# Patient Record
Sex: Male | Born: 1995 | Race: Black or African American | Hispanic: No | Marital: Single | Smoking: Current every day smoker
Health system: Southern US, Community
[De-identification: ages and names within clinical notes are randomized; demographics above are authoritative.]

## PROBLEM LIST (undated history)

## (undated) DIAGNOSIS — Z789 Other specified health status: Secondary | ICD-10-CM

## (undated) HISTORY — PX: NO PAST SURGERIES: SHX2092

---
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
## 2013-09-14 ENCOUNTER — Emergency Department (HOSPITAL_COMMUNITY): Payer: No Typology Code available for payment source

## 2013-09-14 ENCOUNTER — Emergency Department (HOSPITAL_COMMUNITY)
Admission: EM | Admit: 2013-09-14 | Discharge: 2013-09-14 | Disposition: A | Discharge status: Home / Self Care | Payer: No Typology Code available for payment source | Attending: Emergency Medicine | Admitting: Emergency Medicine

## 2013-09-14 ENCOUNTER — Encounter (HOSPITAL_COMMUNITY): Payer: Self-pay | Admitting: Emergency Medicine

## 2013-09-14 DIAGNOSIS — R519 Headache, unspecified: Secondary | ICD-10-CM

## 2013-09-14 DIAGNOSIS — S0990XA Unspecified injury of head, initial encounter: Secondary | ICD-10-CM | POA: Insufficient documentation

## 2013-09-14 DIAGNOSIS — S86912A Strain of unspecified muscle(s) and tendon(s) at lower leg level, left leg, initial encounter: Secondary | ICD-10-CM

## 2013-09-14 DIAGNOSIS — IMO0002 Reserved for concepts with insufficient information to code with codable children: Secondary | ICD-10-CM | POA: Insufficient documentation

## 2013-09-14 DIAGNOSIS — F17200 Nicotine dependence, unspecified, uncomplicated: Secondary | ICD-10-CM | POA: Insufficient documentation

## 2013-09-14 DIAGNOSIS — Y9389 Activity, other specified: Secondary | ICD-10-CM | POA: Insufficient documentation

## 2013-09-14 DIAGNOSIS — Y92410 Unspecified street and highway as the place of occurrence of the external cause: Secondary | ICD-10-CM | POA: Insufficient documentation

## 2013-09-14 MED ORDER — CYCLOBENZAPRINE HCL 10 MG PO TABS: 10.0000 mg | ORAL_TABLET | Freq: Two times a day (BID) | ORAL | Status: DC | PRN

## 2013-09-14 MED ORDER — TRAMADOL HCL 50 MG PO TABS: 50.0000 mg | ORAL_TABLET | Freq: Four times a day (QID) | ORAL | Status: DC | PRN

## 2013-09-14 MED ORDER — TRAMADOL HCL 50 MG PO TABS
50.0000 mg | ORAL_TABLET | Freq: Once | ORAL | Status: AC
  Administered 2013-09-14: 50 mg via ORAL
  Filled 2013-09-14: qty 1
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
## 2013-09-14 NOTE — ED Provider Notes (Signed)
Medical screening examination/treatment/procedure(s) were performed by non-physician practitioner and as supervising physician I was immediately available for consultation/collaboration.  EKG Interpretation   None        Rye Dorado, MD 09/14/13 2336 
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
## 2014-08-09 ENCOUNTER — Encounter (HOSPITAL_COMMUNITY): Payer: Self-pay

## 2014-08-09 ENCOUNTER — Emergency Department (HOSPITAL_COMMUNITY)
Admission: EM | Admit: 2014-08-09 | Discharge: 2014-08-09 | Disposition: A | Discharge status: Home / Self Care | Payer: No Typology Code available for payment source | Attending: Emergency Medicine | Admitting: Emergency Medicine

## 2014-08-09 DIAGNOSIS — Z202 Contact with and (suspected) exposure to infections with a predominantly sexual mode of transmission: Secondary | ICD-10-CM | POA: Insufficient documentation

## 2014-08-09 DIAGNOSIS — Z720 Tobacco use: Secondary | ICD-10-CM | POA: Insufficient documentation

## 2014-08-09 DIAGNOSIS — Z79899 Other long term (current) drug therapy: Secondary | ICD-10-CM | POA: Insufficient documentation

## 2014-08-09 MED ORDER — AZITHROMYCIN 250 MG PO TABS
1000.0000 mg | ORAL_TABLET | Freq: Once | ORAL | Status: AC
  Administered 2014-08-09: 1000 mg via ORAL
  Filled 2014-08-09: qty 4

## 2014-08-09 NOTE — ED Notes (Signed)
Pt states that his girlfriend was dx with chlamydia and he wants to be checked
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
## 2014-08-10 LAB — GC/CHLAMYDIA PROBE AMP
CT PROBE, AMP APTIMA: POSITIVE — AB
GC PROBE AMP APTIMA: NEGATIVE

## 2014-08-11 ENCOUNTER — Telehealth: Payer: Self-pay | Admitting: Emergency Medicine

## 2014-08-11 NOTE — Telephone Encounter (Signed)
Positive Chlamydia culture Treated with Zithromax per protocol MD [facility] faxed.  08/11/14 @ 1652 left voicemail for patient to call office #

## 2014-08-12 ENCOUNTER — Telehealth (HOSPITAL_COMMUNITY): Payer: Self-pay

## 2014-08-12 NOTE — ED Notes (Signed)
spoke with pt. verified ID. informed of lab results. treated per protocol. advised to notify partner(s)

## 2014-12-04 ENCOUNTER — Emergency Department (HOSPITAL_COMMUNITY)
Admission: EM | Admit: 2014-12-04 | Discharge: 2014-12-04 | Disposition: A | Discharge status: Home / Self Care | Payer: Self-pay

## 2014-12-04 ENCOUNTER — Encounter (HOSPITAL_COMMUNITY): Payer: Self-pay | Admitting: Emergency Medicine

## 2014-12-04 DIAGNOSIS — Y9289 Other specified places as the place of occurrence of the external cause: Secondary | ICD-10-CM | POA: Insufficient documentation

## 2014-12-04 DIAGNOSIS — Y998 Other external cause status: Secondary | ICD-10-CM | POA: Insufficient documentation

## 2014-12-04 DIAGNOSIS — T50901A Poisoning by unspecified drugs, medicaments and biological substances, accidental (unintentional), initial encounter: Secondary | ICD-10-CM | POA: Diagnosis present

## 2014-12-04 DIAGNOSIS — Y9389 Activity, other specified: Secondary | ICD-10-CM | POA: Insufficient documentation

## 2014-12-04 DIAGNOSIS — T1491XA Suicide attempt, initial encounter: Secondary | ICD-10-CM | POA: Diagnosis present

## 2014-12-04 DIAGNOSIS — F4325 Adjustment disorder with mixed disturbance of emotions and conduct: Secondary | ICD-10-CM | POA: Diagnosis present

## 2014-12-04 DIAGNOSIS — Z720 Tobacco use: Secondary | ICD-10-CM | POA: Insufficient documentation

## 2014-12-04 DIAGNOSIS — R000 Tachycardia, unspecified: Secondary | ICD-10-CM | POA: Insufficient documentation

## 2014-12-04 DIAGNOSIS — X58XXXA Exposure to other specified factors, initial encounter: Secondary | ICD-10-CM | POA: Insufficient documentation

## 2014-12-04 DIAGNOSIS — T1491 Suicide attempt: Secondary | ICD-10-CM

## 2014-12-04 DIAGNOSIS — F322 Major depressive disorder, single episode, severe without psychotic features: Secondary | ICD-10-CM | POA: Diagnosis present

## 2014-12-04 DIAGNOSIS — T378X1A Poisoning by other specified systemic anti-infectives and antiparasitics, accidental (unintentional), initial encounter: Secondary | ICD-10-CM | POA: Insufficient documentation

## 2014-12-04 DIAGNOSIS — Z79899 Other long term (current) drug therapy: Secondary | ICD-10-CM | POA: Insufficient documentation

## 2014-12-04 LAB — ACETAMINOPHEN LEVEL: Acetaminophen (Tylenol), Serum: <10 ug/mL — ABNORMAL LOW (ref 10–30)

## 2014-12-04 LAB — CBC
HEMATOCRIT: 44.5 % (ref 39.0–52.0)
Hemoglobin: 14.9 g/dL (ref 13.0–17.0)
MCH: 32.3 pg (ref 26.0–34.0)
MCHC: 33.5 g/dL (ref 30.0–36.0)
MCV: 96.3 fL (ref 78.0–100.0)
Platelets: 208 10*3/uL (ref 150–400)
RBC: 4.62 MIL/uL (ref 4.22–5.81)
RDW: 13.1 % (ref 11.5–15.5)
WBC: 5.2 10*3/uL (ref 4.0–10.5)

## 2014-12-04 LAB — COMPREHENSIVE METABOLIC PANEL
ALBUMIN: 4.3 g/dL (ref 3.5–5.2)
ALT: 13 U/L (ref 0–53)
ANION GAP: 10 (ref 5–15)
AST: 23 U/L (ref 0–37)
Alkaline Phosphatase: 67 U/L (ref 39–117)
BILIRUBIN TOTAL: 0.5 mg/dL (ref 0.3–1.2)
BUN: 13 mg/dL (ref 6–23)
CALCIUM: 8.9 mg/dL (ref 8.4–10.5)
CHLORIDE: 110 mmol/L (ref 96–112)
CO2: 21 mmol/L (ref 19–32)
CREATININE: 0.94 mg/dL (ref 0.50–1.35)
GFR calc non Af Amer: >90 mL/min (ref >90)
GLUCOSE: 88 mg/dL (ref 70–99)
POTASSIUM: 3.5 mmol/L (ref 3.5–5.1)
Sodium: 141 mmol/L (ref 135–145)
TOTAL PROTEIN: 7.8 g/dL (ref 6.0–8.3)

## 2014-12-04 LAB — ETHANOL: Alcohol, Ethyl (B): 104 mg/dL — ABNORMAL HIGH (ref 0–9)

## 2014-12-04 LAB — SALICYLATE LEVEL

## 2014-12-04 MED ORDER — MIDAZOLAM HCL 2 MG/2ML IJ SOLN
INTRAMUSCULAR | Status: AC
  Filled 2014-12-04: qty 4

## 2014-12-04 MED ORDER — ZIPRASIDONE MESYLATE 20 MG IM SOLR: 20.0000 mg | Freq: Once | INTRAMUSCULAR | Status: DC

## 2014-12-04 MED ORDER — LORAZEPAM 2 MG/ML IJ SOLN: 2.0000 mg | Freq: Once | INTRAMUSCULAR | Status: DC

## 2014-12-04 MED ORDER — STERILE WATER FOR INJECTION IJ SOLN
INTRAMUSCULAR | Status: AC
  Administered 2014-12-04: 2 mL
  Filled 2014-12-04: qty 10

## 2014-12-04 MED ORDER — NICOTINE 21 MG/24HR TD PT24: 21.0000 mg | MEDICATED_PATCH | Freq: Every day | TRANSDERMAL | Status: DC

## 2014-12-04 MED ORDER — DIPHENHYDRAMINE HCL 50 MG/ML IJ SOLN: 50.0000 mg | Freq: Once | INTRAMUSCULAR | Status: DC

## 2014-12-04 MED ORDER — ZIPRASIDONE MESYLATE 20 MG IM SOLR: 20.0000 mg | Freq: Once | INTRAMUSCULAR | Status: DC | PRN

## 2014-12-04 MED ORDER — ZIPRASIDONE MESYLATE 20 MG IM SOLR
10.0000 mg | Freq: Once | INTRAMUSCULAR | Status: AC
  Administered 2014-12-04: 10 mg via INTRAMUSCULAR

## 2014-12-04 MED ORDER — ZIPRASIDONE MESYLATE 20 MG IM SOLR
INTRAMUSCULAR | Status: AC
  Filled 2014-12-04: qty 20

## 2014-12-04 MED ORDER — DIPHENHYDRAMINE HCL 50 MG/ML IJ SOLN: 50.0000 mg | Freq: Once | INTRAMUSCULAR | Status: DC | PRN

## 2014-12-04 MED ORDER — MIDAZOLAM HCL 2 MG/2ML IJ SOLN
4.0000 mg | Freq: Once | INTRAMUSCULAR | Status: AC
  Administered 2014-12-04: 4 mg via INTRAMUSCULAR

## 2014-12-04 MED ORDER — LORAZEPAM 2 MG/ML IJ SOLN: 2.0000 mg | Freq: Once | INTRAMUSCULAR | Status: DC | PRN

## 2014-12-04 MED ORDER — LORAZEPAM 1 MG PO TABS: 1.0000 mg | ORAL_TABLET | Freq: Three times a day (TID) | ORAL | Status: DC | PRN
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
## 2014-12-04 NOTE — ED Notes (Signed)
Per EMS: Pt from home.  States that his girlfriend broke up with him so he took 3 (100 mg) tabs of macrobid (still 2 pills left in bottle).  Took them appx 15 mins prior to EMS arrival.  A&O x 4.  States that he wants to die.

## 2014-12-04 NOTE — BH Assessment (Addendum)
Assessment Note   Travis Mcguire is an 19 y.o. male who came in after taking three of his girlfriends antibiotics. Earlier in the ED he was combative with staff, threatening, yelling and had to be restrained. He states that he was "just having a bad day" and got angry. He states that he currently lives with his girlfriend and they got into an argument earlier today about him staying out all night. She stated that she was going to leave him and he took the three pills impulsively. His alcohol level was 104 on admission. He states that he has been stressed lately and admits he has been depressed. He says that he has increased stress due to living on his own and paying bills. He currently denies SI/HI and A/V hallucinations. Despite earlier behavior pt was very calm and cooperative during assessment. His mom was in the room and she agreed to let him stay with her tonight. He signed a no harm contract and stated that he does not feel suicidal at this time. Mom signed the contract as a witness as well as Clinical research associatewriter. Pt to be discharged home to the care of his mom.   Axis I: 296.23 Major Depressive Disorder Single Episode Severe Axis II: Deferred Axis III: History reviewed. No pertinent past medical history. Axis IV: other psychosocial or environmental problems and problems with primary support group Axis V: 41-50 serious symptoms  Past Medical History: History reviewed. No pertinent past medical history.  History reviewed. No pertinent past surgical history.  Family History: History reviewed. No pertinent family history.  Social History:  reports that he has been smoking Cigarettes.  He has been smoking about 0.25 packs per day. He has never used smokeless tobacco. He reports that he uses illicit drugs (Marijuana). He reports that he does not drink alcohol.  Additional Social History:  Alcohol / Drug Use History of alcohol / drug use?: Yes Longest period of sobriety (when/how long): unknown Negative  Consequences of Use: Personal relationships Substance #1 Name of Substance 1: Alcohol  1 - Age of First Use: unknown 1 - Amount (size/oz): small bottle of liquor 1 - Frequency: "once in a while" 1 - Duration: all night  1 - Last Use / Amount: last night   CIWA: CIWA-Ar BP: 110/75 mmHg Pulse Rate: 88 COWS:    PATIENT STRENGTHS: (choose at least two) Average or above average intelligence Physical Health  Allergies: No Known Allergies  Home Medications:  (Not in a hospital admission)  OB/GYN Status:  No LMP for male patient.  General Assessment Data Location of Assessment: [facility] ED Is this a Tele or Face-to-Face Assessment?: Face-to-Face Is this an Initial Assessment or a Re-assessment for this encounter?: Initial Assessment Living Arrangements: Spouse/significant other Can pt return to current living arrangement?: Yes Admission Status: Involuntary Is patient capable of signing voluntary admission?: No Transfer from: Home Referral Source: Self/Family/Friend     [facility] Living Arrangements: Spouse/significant other Name of Psychiatrist:  (None) Name of Therapist: None  Education Status Is patient currently in school?: No Highest grade of school patient has completed: 12th  Risk to self with the past 6 months Suicidal Ideation:  (Denies) Suicidal Intent: No Is patient at risk for suicide?: No Suicidal Plan?: No Access to Means: No What has been your use of drugs/alcohol within the last 12 months?: alcohol last night Previous Attempts/Gestures: No How many times?: 0 Other Self Harm Risks: none Triggers for Past Attempts: None known Intentional Self Injurious Behavior:  None Family Suicide History: No Recent stressful life event(s): Conflict (Comment) (conflict with girlfriend) Persecutory voices/beliefs?: No Depression: Yes Depression Symptoms: Feeling worthless/self pity Substance abuse history and/or treatment for substance abuse?: No Suicide  prevention information given to non-admitted patients: Not applicable  Risk to Others within the past 6 months Homicidal Ideation: No Thoughts of Harm to Others: No Current Homicidal Intent: No Current Homicidal Plan: No Access to Homicidal Means: No Identified Victim: none History of harm to others?: No Assessment of Violence: On admission Violent Behavior Description:  (yelling, threatening needed to be restrained in ED) Does patient have access to weapons?: No Criminal Charges Pending?: No Does patient have a court date: No  Psychosis Hallucinations: None noted Delusions: None noted  Mental Status Report Appear/Hygiene: In scrubs Eye Contact: Good Motor Activity: Freedom of movement Speech: Logical/coherent Level of Consciousness: Alert Mood: Depressed Affect: Depressed Anxiety Level: None Thought Processes: Coherent Judgement: Impaired Orientation: Person, Place, Time, Situation Obsessive Compulsive Thoughts/Behaviors: None  Cognitive Functioning Concentration: Normal Memory: Recent Intact, Remote Intact IQ: Average Insight: Poor Impulse Control: Poor Appetite: Good Weight Loss: 0 Weight Gain: 0 Sleep: Decreased Total Hours of Sleep: 2 Vegetative Symptoms: Unable to Assess  ADLScreening [facility] Assessment Services) Patient's cognitive ability adequate to safely complete daily activities?: Yes Patient able to express need for assistance with ADLs?: Yes Independently performs ADLs?: Yes (appropriate for developmental age)  Prior Inpatient Therapy Prior Inpatient Therapy: No  Prior Outpatient Therapy Prior Outpatient Therapy: No  ADL Screening (condition at time of admission) Patient's cognitive ability adequate to safely complete daily activities?: Yes Is the patient deaf or have difficulty hearing?: No Does the patient have difficulty seeing, even when wearing glasses/contacts?: No Does the patient have difficulty concentrating, remembering, or making  decisions?: No Patient able to express need for assistance with ADLs?: Yes Does the patient have difficulty dressing or bathing?: No Independently performs ADLs?: Yes (appropriate for developmental age) Does the patient have difficulty walking or climbing stairs?: No Weakness of Legs: None Weakness of Arms/Hands: None  Home Assistive Devices/Equipment Home Assistive Devices/Equipment: None    Abuse/Neglect Assessment (Assessment to be complete while patient is alone) Physical Abuse: Denies Verbal Abuse: Denies Sexual Abuse: Denies Exploitation of patient/patient's resources: Denies Self-Neglect: Denies Values / Beliefs Cultural Requests During Hospitalization: None Spiritual Requests During Hospitalization: None Consults Spiritual Care Consult Needed: No Advance Directives (For Healthcare) Does patient have an advance directive?: No Would patient like information on creating an advanced directive?: No - patient declined information    Additional Information 1:1 In Past 12 Months?: No     Disposition:  Disposition Initial Assessment Completed for this Encounter: Yes Disposition of Patient: Other dispositions Other disposition(s): Referred to outside facility  [facility] 12/04/2014 4:48 PM

## 2014-12-04 NOTE — ED Notes (Signed)
Dr. Shela CommonsJ has just seen him and informed him he may not leave.  Pt. Had loudly proclaimed his intention to leave to Dr. Shela CommonsJ.

## 2014-12-04 NOTE — ED Notes (Signed)
Attempted to talk with pt about what happened today.  States that he does not want to live and that he took pills to kill himself d/t argument with girlfriend.  Med clearance process explained.  Pt states "I ain't changing shit for nobody!".  Pt continued to escalate.  [employer] at bedside.  Continued to escalate.  Threatening staff.  Dr. Ethelda ChickJacubowitz called to bedside.  IVC commitment orders received.  Attempted to calmly have pt cooperate.  This was not happening.  Pt restrained by [employer] cuffs to stretcher chair.  4 mg of versed given in two separate shots into upper leg by this Clinical research associatewriter and Alexia FreestonePatty, Charity fundraiserN.  Pt moved to rm 14.
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
## 2014-12-04 NOTE — ED Notes (Signed)
He is awake and eating lunch.  His mother and our sitter remain with him.  He is in no distress.  Dr. Shela CommonsJ states he will see pt. [facility]

## 2015-08-08 ENCOUNTER — Emergency Department (HOSPITAL_COMMUNITY)
Admission: EM | Admit: 2015-08-08 | Discharge: 2015-08-11 | Disposition: A | Discharge status: Other Acute Inpatient Hospital | Payer: Self-pay | Attending: Emergency Medicine | Admitting: Emergency Medicine

## 2015-08-08 ENCOUNTER — Encounter (HOSPITAL_COMMUNITY): Payer: Self-pay | Admitting: Emergency Medicine

## 2015-08-08 DIAGNOSIS — F329 Major depressive disorder, single episode, unspecified: Secondary | ICD-10-CM | POA: Insufficient documentation

## 2015-08-08 DIAGNOSIS — F1094 Alcohol use, unspecified with alcohol-induced mood disorder: Secondary | ICD-10-CM

## 2015-08-08 DIAGNOSIS — R45851 Suicidal ideations: Secondary | ICD-10-CM | POA: Insufficient documentation

## 2015-08-08 DIAGNOSIS — F32A Depression, unspecified: Secondary | ICD-10-CM

## 2015-08-08 DIAGNOSIS — F4325 Adjustment disorder with mixed disturbance of emotions and conduct: Secondary | ICD-10-CM | POA: Diagnosis present

## 2015-08-08 DIAGNOSIS — Z720 Tobacco use: Secondary | ICD-10-CM | POA: Insufficient documentation

## 2015-08-08 DIAGNOSIS — F1310 Sedative, hypnotic or anxiolytic abuse, uncomplicated: Secondary | ICD-10-CM | POA: Insufficient documentation

## 2015-08-08 DIAGNOSIS — F1210 Cannabis abuse, uncomplicated: Secondary | ICD-10-CM | POA: Insufficient documentation

## 2015-08-08 LAB — COMPREHENSIVE METABOLIC PANEL
ALBUMIN: 4.8 g/dL (ref 3.5–5.0)
ALK PHOS: 60 U/L (ref 38–126)
ALT: 12 U/L — ABNORMAL LOW (ref 17–63)
AST: 22 U/L (ref 15–41)
Anion gap: 11 (ref 5–15)
BILIRUBIN TOTAL: 0.6 mg/dL (ref 0.3–1.2)
BUN: 13 mg/dL (ref 6–20)
CALCIUM: 9.4 mg/dL (ref 8.9–10.3)
CO2: 25 mmol/L (ref 22–32)
Chloride: 106 mmol/L (ref 101–111)
Creatinine, Ser: 1.06 mg/dL (ref 0.61–1.24)
GFR calc Af Amer: >60 mL/min (ref >60)
GLUCOSE: 106 mg/dL — AB (ref 65–99)
POTASSIUM: 3.2 mmol/L — AB (ref 3.5–5.1)
Sodium: 142 mmol/L (ref 135–145)
TOTAL PROTEIN: 8 g/dL (ref 6.5–8.1)

## 2015-08-08 LAB — ACETAMINOPHEN LEVEL

## 2015-08-08 LAB — CBC
HEMATOCRIT: 43.2 % (ref 39.0–52.0)
Hemoglobin: 15.3 g/dL (ref 13.0–17.0)
MCH: 33.3 pg (ref 26.0–34.0)
MCHC: 35.4 g/dL (ref 30.0–36.0)
MCV: 93.9 fL (ref 78.0–100.0)
PLATELETS: 155 10*3/uL (ref 150–400)
RBC: 4.6 MIL/uL (ref 4.22–5.81)
RDW: 12.8 % (ref 11.5–15.5)
WBC: 5.7 10*3/uL (ref 4.0–10.5)

## 2015-08-08 LAB — ETHANOL: ALCOHOL ETHYL (B): 54 mg/dL — AB (ref <5)

## 2015-08-08 LAB — SALICYLATE LEVEL: Salicylate Lvl: <4 mg/dL (ref 2.8–30.0)

## 2015-08-08 MED ORDER — ONDANSETRON HCL 4 MG PO TABS: 4.0000 mg | ORAL_TABLET | Freq: Three times a day (TID) | ORAL | Status: DC | PRN

## 2015-08-08 MED ORDER — ZOLPIDEM TARTRATE 5 MG PO TABS
5.0000 mg | ORAL_TABLET | Freq: Every evening | ORAL | Status: DC | PRN
  Administered 2015-08-09: 5 mg via ORAL
  Filled 2015-08-08: qty 1

## 2015-08-08 MED ORDER — IBUPROFEN 200 MG PO TABS: 600.0000 mg | ORAL_TABLET | Freq: Three times a day (TID) | ORAL | Status: DC | PRN

## 2015-08-08 MED ORDER — ACETAMINOPHEN 325 MG PO TABS: 650.0000 mg | ORAL_TABLET | ORAL | Status: DC | PRN

## 2015-08-08 MED ORDER — LORAZEPAM 1 MG PO TABS
1.0000 mg | ORAL_TABLET | Freq: Three times a day (TID) | ORAL | Status: DC | PRN
  Administered 2015-08-09 (×2): 1 mg via ORAL
  Filled 2015-08-08 (×3): qty 1

## 2015-08-08 NOTE — ED Notes (Signed)
Pt states that he has been stressed out "for a long time" and stated that he did want to hurt himself. Denies plan. Alert and oriented.

## 2015-08-09 DIAGNOSIS — R45851 Suicidal ideations: Secondary | ICD-10-CM | POA: Insufficient documentation

## 2015-08-09 DIAGNOSIS — R45850 Homicidal ideations: Secondary | ICD-10-CM

## 2015-08-09 DIAGNOSIS — F1094 Alcohol use, unspecified with alcohol-induced mood disorder: Secondary | ICD-10-CM

## 2015-08-09 MED ORDER — DIPHENHYDRAMINE HCL 50 MG/ML IJ SOLN: 50.0000 mg | Freq: Once | INTRAMUSCULAR | Status: DC | PRN

## 2015-08-09 MED ORDER — LORAZEPAM 2 MG/ML IJ SOLN: 2.0000 mg | Freq: Once | INTRAMUSCULAR | Status: DC | PRN

## 2015-08-09 MED ORDER — ZIPRASIDONE MESYLATE 20 MG IM SOLR: 20.0000 mg | Freq: Once | INTRAMUSCULAR | Status: DC | PRN

## 2015-08-09 NOTE — ED Notes (Signed)
Pt resting in room, in no acute distress 

## 2015-08-09 NOTE — ED Notes (Signed)
Belongings sent home with friend- [place] [phone]

## 2015-08-09 NOTE — ED Notes (Signed)
Pt resting well, in acute distress

## 2015-08-09 NOTE — ED Notes (Signed)
Pt stated that he doesn't have to use restroom at this time.

## 2015-08-09 NOTE — ED Notes (Signed)
Pt will see psychiatry in the am for disposition

## 2015-08-09 NOTE — BH Assessment (Signed)
[facility] Assessment Progress Note  The following facilities have been contacted to seek placement for this pt, with results as noted:  Beds available, information sent, decision pending:  [facility]   At capacity:  [facility]   Josselin Gaulin, MA Triage Specialist [phone]     
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
## 2015-08-09 NOTE — ED Notes (Signed)
Pt oriented to room and unit.  Patient is very irritable and states he will "F-this place up if yall don't let me go"  He was explained the procedure and about his IVC.  He remains extremely angry.  15 minute checks and video monitoring continue.  When his lunch came he became verbally abusive and said "I can't eat this shit":

## 2015-08-09 NOTE — ED Notes (Signed)
Pt asleep at time of vitals. RN notified and RN directed this Clinical research associatewriter not to wake pt up as he was agitated before falling asleep.
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
## 2015-08-09 NOTE — Progress Notes (Signed)
CM spoke with pt who [place].  CM discussed and provided written information for uninsured accepting pcps, discussed the importance of pcp vs EDP services for f/u care, www.needymeds.org, www.goodrx.com, discounted pharmacies and other [place] resources such as [facility] , [facility], affordable care act, financial assistance, uninsured dental services, [facility], DSS and  health department  Reviewed resources for [place] uninsured accepting pcps like [facility], [facility] medicine [facility] [address], [facility], [facility], local urgent care centers, [facility], [facility], general medical clinics, [facility], [facility] plus others, medication resources, [facility] out patient pharmacies and housing Pt voiced understanding and appreciation of resources provided   Provided [facility] contact information

## 2015-08-09 NOTE — ED Notes (Signed)
Patient observed in room with visitor (friend). Patient expressed that he is ready to leave. Patient educated on process and verbalized understanding. Patient states his friend brought him in because of what he was posting on face book and calling him saying that made friend feel he was suicidal. Patient states he takes "xanax" that he gets from others to help him sleep. Patient is calm and cooperative at this time. Q 15 minute checks maintained and in progress. Monitoring of patient continues.

## 2015-08-09 NOTE — ED Notes (Signed)
[facility]  Pt upset at transfer and raising voice.  Not wanting to stay.  Security on standby to assist with transfer.
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
## 2015-08-09 NOTE — ED Notes (Signed)
Berna SpareMarcus in evaluating patient

## 2015-08-10 LAB — COMPREHENSIVE METABOLIC PANEL
ALT: 14 U/L — ABNORMAL LOW (ref 17–63)
ANION GAP: 5 (ref 5–15)
AST: 38 U/L (ref 15–41)
Albumin: 4.1 g/dL (ref 3.5–5.0)
Alkaline Phosphatase: 48 U/L (ref 38–126)
BUN: 17 mg/dL (ref 6–20)
CALCIUM: 9.1 mg/dL (ref 8.9–10.3)
CHLORIDE: 107 mmol/L (ref 101–111)
CO2: 27 mmol/L (ref 22–32)
Creatinine, Ser: 1.19 mg/dL (ref 0.61–1.24)
GFR calc non Af Amer: >60 mL/min (ref >60)
Glucose, Bld: 142 mg/dL — ABNORMAL HIGH (ref 65–99)
POTASSIUM: 3.5 mmol/L (ref 3.5–5.1)
SODIUM: 139 mmol/L (ref 135–145)
Total Bilirubin: 1 mg/dL (ref 0.3–1.2)
Total Protein: 6.9 g/dL (ref 6.5–8.1)

## 2015-08-10 LAB — RAPID URINE DRUG SCREEN, HOSP PERFORMED
AMPHETAMINES: NOT DETECTED
BARBITURATES: NOT DETECTED
Benzodiazepines: POSITIVE — AB
COCAINE: NOT DETECTED
Opiates: NOT DETECTED
TETRAHYDROCANNABINOL: POSITIVE — AB

## 2015-08-10 LAB — ETHANOL: Alcohol, Ethyl (B): <5 mg/dL (ref <5)

## 2015-08-10 MED ORDER — DIPHENHYDRAMINE HCL 50 MG/ML IJ SOLN
50.0000 mg | Freq: Once | INTRAMUSCULAR | Status: AC
  Administered 2015-08-10: 50 mg via INTRAMUSCULAR
  Filled 2015-08-10: qty 1

## 2015-08-10 MED ORDER — LORAZEPAM 2 MG/ML IJ SOLN
2.0000 mg | Freq: Once | INTRAMUSCULAR | Status: AC
  Administered 2015-08-10: 2 mg via INTRAMUSCULAR
  Filled 2015-08-10: qty 1

## 2015-08-10 MED ORDER — FLUOXETINE HCL 10 MG PO CAPS
10.0000 mg | ORAL_CAPSULE | Freq: Every day | ORAL | Status: DC
  Administered 2015-08-10 – 2015-08-11 (×2): 10 mg via ORAL
  Filled 2015-08-10 (×2): qty 1

## 2015-08-10 MED ORDER — ZIPRASIDONE MESYLATE 20 MG IM SOLR
20.0000 mg | Freq: Once | INTRAMUSCULAR | Status: AC
  Administered 2015-08-10: 20 mg via INTRAMUSCULAR
  Filled 2015-08-10: qty 20

## 2015-08-10 MED ORDER — HYDROXYZINE HCL 25 MG PO TABS
25.0000 mg | ORAL_TABLET | Freq: Three times a day (TID) | ORAL | Status: DC | PRN
  Administered 2015-08-10: 25 mg via ORAL
  Filled 2015-08-10: qty 1

## 2015-08-10 MED ORDER — TRAZODONE HCL 100 MG PO TABS
100.0000 mg | ORAL_TABLET | Freq: Every day | ORAL | Status: DC
Start: 2015-08-10 — End: 2015-08-11
  Administered 2015-08-10: 100 mg via ORAL
  Filled 2015-08-10: qty 1

## 2015-08-10 MED ORDER — STERILE WATER FOR INJECTION IJ SOLN
INTRAMUSCULAR | Status: AC
  Administered 2015-08-10: 13:00:00
  Filled 2015-08-10: qty 10

## 2015-08-10 MED ORDER — OLANZAPINE 10 MG PO TBDP
10.0000 mg | ORAL_TABLET | Freq: Three times a day (TID) | ORAL | Status: DC | PRN
  Filled 2015-08-10: qty 1

## 2015-08-10 MED ORDER — POTASSIUM CHLORIDE CRYS ER 20 MEQ PO TBCR
40.0000 meq | EXTENDED_RELEASE_TABLET | Freq: Once | ORAL | Status: AC
  Administered 2015-08-10: 40 meq via ORAL
  Filled 2015-08-10: qty 2

## 2015-08-10 MED ORDER — CARBAMAZEPINE 200 MG PO TABS
200.0000 mg | ORAL_TABLET | Freq: Two times a day (BID) | ORAL | Status: DC
  Administered 2015-08-10 – 2015-08-11 (×2): 200 mg via ORAL
  Filled 2015-08-10 (×4): qty 1

## 2015-08-10 NOTE — BH Assessment (Signed)
Reassessment 08/10/2015:  Writer met with patient face to face. He denies SI, HI, and AVH's. Sts, "I think I was intoxicated and that is why I am here".  Patient is currently alert. He is calm and cooperative. He appears to be insightful. He sts, "I don't belong here and I am not like the other people here". He denies prior history of self harm or inpatient hospitalization. Patient reports that his appetite is ok but doesn't like the food here. His is also sleeping ok.   Patient is requesting to speak with the psychiatrist Dr. Darleene Cleaver face to face. Writer assured patient that our conversation would be noted and discussed with psychiatrist.

## 2015-08-10 NOTE — ED Notes (Signed)
After being told that the MD would not see him and that he was not being discharged pt. Became very agitated and threatened to tear up the place.  He feels he does not belong here and he denies SI.  Pt had been very cooperative up until he found this out.  Prn orders obtained from md.  15 minute checks and video monitoring continue.

## 2015-08-10 NOTE — BH Assessment (Signed)
[facility] Assessment Progress Note  The following facilities have been contacted to seek placement for this pt, with results as noted:  Beds available, information sent, decision pending:  [facility]   [facility]   Seraphim [place], [place] Triage Specialist [phone]

## 2015-08-10 NOTE — BH Assessment (Signed)
Per Dr. Jannifer FranklinAkintayo, patient will remain in the ED for placement. He is [facility] hall [facility]. No beds at this time. TTS to seek placement. Patient made aware of his disposition which is [facility] or another appropriate facility.  He became upset learning that he would not be discharged home today. Patient sts, "I don't belong here". Patient again asking to see psychiatrist. Writer informed patient that the psychiatrist was made aware of his request but would see him today. Patient became increasingly frustrated stating, "I need to be knocked out and tied to the bed while I am here". Patient left the room asking his nurse for medications to "knock me out". Patient punched his room door as he was leaving his room.

## 2015-08-11 NOTE — BH Assessment (Signed)
[facility] Assessment Progress Note   [facility] has been [facility] by Dr. Lonni Fixaj Thotokura.  Nurse call back number is [phone].  [facility]  Said that they can take patient after 09:00.  Clinician [facility] for [facility].

## 2015-08-11 NOTE — ED Notes (Signed)
Patient cooperative. [facility] [facility] Denies pain. Safety maintained. Report given to Kary KosBetty O. RN at [facility]. No apparent distress. IVC paperwork given to officer.

## 2015-08-11 NOTE — ED Notes (Signed)
Patient denies SI , Hi and AVH at this time. Plan of care discussed with patient. Patient informed that a urine sample was needed. Patient voices no complaints or concerns at this time. Encouragement and support provided and safety maintain. Q 15 min safety checks remain in place.

## 2015-08-19 ENCOUNTER — Encounter (HOSPITAL_BASED_OUTPATIENT_CLINIC_OR_DEPARTMENT_OTHER): Payer: Self-pay | Admitting: Emergency Medicine

## 2015-12-18 ENCOUNTER — Emergency Department (HOSPITAL_COMMUNITY)
Admission: EM | Admit: 2015-12-18 | Discharge: 2015-12-18 | Disposition: A | Discharge status: Home / Self Care | Payer: No Typology Code available for payment source | Attending: Emergency Medicine | Admitting: Emergency Medicine

## 2015-12-18 ENCOUNTER — Encounter (HOSPITAL_COMMUNITY): Payer: Self-pay | Admitting: Emergency Medicine

## 2015-12-18 DIAGNOSIS — Z711 Person with feared health complaint in whom no diagnosis is made: Secondary | ICD-10-CM

## 2015-12-18 DIAGNOSIS — F17210 Nicotine dependence, cigarettes, uncomplicated: Secondary | ICD-10-CM | POA: Insufficient documentation

## 2015-12-18 DIAGNOSIS — Z202 Contact with and (suspected) exposure to infections with a predominantly sexual mode of transmission: Secondary | ICD-10-CM | POA: Insufficient documentation

## 2015-12-18 DIAGNOSIS — N390 Urinary tract infection, site not specified: Secondary | ICD-10-CM | POA: Insufficient documentation

## 2015-12-18 LAB — URINALYSIS, ROUTINE W REFLEX MICROSCOPIC
Bilirubin Urine: NEGATIVE
Glucose, UA: NEGATIVE mg/dL
HGB URINE DIPSTICK: NEGATIVE
Ketones, ur: NEGATIVE mg/dL
NITRITE: NEGATIVE
PROTEIN: NEGATIVE mg/dL
Specific Gravity, Urine: 1.018 (ref 1.005–1.030)
pH: 7.5 (ref 5.0–8.0)

## 2015-12-18 LAB — URINE MICROSCOPIC-ADD ON: RBC / HPF: NONE SEEN RBC/hpf (ref 0–5)

## 2015-12-18 MED ORDER — LIDOCAINE HCL 1 % IJ SOLN
INTRAMUSCULAR | Status: AC
  Administered 2015-12-18: 1.5 mL
  Filled 2015-12-18: qty 20

## 2015-12-18 MED ORDER — CEPHALEXIN 500 MG PO CAPS: 500.0000 mg | ORAL_CAPSULE | Freq: Two times a day (BID) | ORAL | Status: DC

## 2015-12-18 MED ORDER — CEFTRIAXONE SODIUM 250 MG IJ SOLR
250.0000 mg | Freq: Once | INTRAMUSCULAR | Status: AC
  Administered 2015-12-18: 250 mg via INTRAMUSCULAR
  Filled 2015-12-18: qty 250

## 2015-12-18 MED ORDER — AZITHROMYCIN 250 MG PO TABS
1000.0000 mg | ORAL_TABLET | Freq: Once | ORAL | Status: AC
  Administered 2015-12-18: 1000 mg via ORAL
  Filled 2015-12-18: qty 4
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
## 2015-12-18 NOTE — Discharge Instructions (Signed)
1. Medications: keflex, usual home medications 2. Treatment: rest, drink plenty of fluids, use protection while sexually active 3. Follow Up: please followup with your primary doctor for discussion of your diagnoses and further evaluation after today's visit; if you do not have a primary care doctor use the phone number listed in your discharge paperwork to find one; please return to the ER for high fever, severe abdominal pain, new or worsening symptoms

## 2015-12-18 NOTE — ED Notes (Signed)
Per pt states he thinks he was exposed to and STD

## 2015-12-19 LAB — GC/CHLAMYDIA PROBE AMP (~~LOC~~) NOT AT ARMC
CHLAMYDIA, DNA PROBE: POSITIVE — AB
NEISSERIA GONORRHEA: POSITIVE — AB

## 2015-12-20 ENCOUNTER — Telehealth (HOSPITAL_BASED_OUTPATIENT_CLINIC_OR_DEPARTMENT_OTHER): Payer: Self-pay | Admitting: Emergency Medicine

## 2015-12-20 LAB — URINE CULTURE: Culture: NO GROWTH

## 2016-05-10 ENCOUNTER — Emergency Department (HOSPITAL_COMMUNITY)
Admission: EM | Admit: 2016-05-10 | Discharge: 2016-05-11 | Discharge status: Against Medical Advice | Payer: No Typology Code available for payment source

## 2016-05-10 NOTE — ED Notes (Signed)
Pt called again for triage no answer from lobby

## 2016-05-10 NOTE — ED Notes (Signed)
Pt called for triage no answer from the lobby

## 2016-05-10 NOTE — ED Notes (Signed)
Pt called for triage again still no answer

## 2016-06-03 ENCOUNTER — Emergency Department (HOSPITAL_COMMUNITY): Payer: Medicaid Other

## 2016-06-03 ENCOUNTER — Inpatient Hospital Stay (HOSPITAL_COMMUNITY): Payer: Medicaid Other

## 2016-06-03 ENCOUNTER — Inpatient Hospital Stay (HOSPITAL_COMMUNITY)
Admission: EM | Admit: 2016-06-03 | Discharge: 2016-06-13 | DRG: 083 | Disposition: A | Discharge status: Rehabilitation Facility | Payer: Medicaid Other | Attending: General Surgery | Admitting: General Surgery

## 2016-06-03 DIAGNOSIS — S12600A Unspecified displaced fracture of seventh cervical vertebra, initial encounter for closed fracture: Secondary | ICD-10-CM | POA: Diagnosis present

## 2016-06-03 DIAGNOSIS — T1490XA Injury, unspecified, initial encounter: Secondary | ICD-10-CM

## 2016-06-03 DIAGNOSIS — S0282XA Fracture of other specified skull and facial bones, left side, initial encounter for closed fracture: Secondary | ICD-10-CM | POA: Diagnosis present

## 2016-06-03 DIAGNOSIS — Y92488 Other paved roadways as the place of occurrence of the external cause: Secondary | ICD-10-CM

## 2016-06-03 DIAGNOSIS — S06339A Contusion and laceration of cerebrum, unspecified, with loss of consciousness of unspecified duration, initial encounter: Secondary | ICD-10-CM | POA: Diagnosis present

## 2016-06-03 DIAGNOSIS — F17210 Nicotine dependence, cigarettes, uncomplicated: Secondary | ICD-10-CM | POA: Diagnosis present

## 2016-06-03 DIAGNOSIS — F1210 Cannabis abuse, uncomplicated: Secondary | ICD-10-CM | POA: Diagnosis present

## 2016-06-03 DIAGNOSIS — I619 Nontraumatic intracerebral hemorrhage, unspecified: Secondary | ICD-10-CM

## 2016-06-03 DIAGNOSIS — F1010 Alcohol abuse, uncomplicated: Secondary | ICD-10-CM | POA: Diagnosis present

## 2016-06-03 DIAGNOSIS — S01112A Laceration without foreign body of left eyelid and periocular area, initial encounter: Secondary | ICD-10-CM | POA: Diagnosis present

## 2016-06-03 DIAGNOSIS — M25521 Pain in right elbow: Secondary | ICD-10-CM

## 2016-06-03 DIAGNOSIS — S065X9A Traumatic subdural hemorrhage with loss of consciousness of unspecified duration, initial encounter: Secondary | ICD-10-CM | POA: Diagnosis present

## 2016-06-03 DIAGNOSIS — S82402A Unspecified fracture of shaft of left fibula, initial encounter for closed fracture: Secondary | ICD-10-CM | POA: Diagnosis present

## 2016-06-03 DIAGNOSIS — F1910 Other psychoactive substance abuse, uncomplicated: Secondary | ICD-10-CM

## 2016-06-03 DIAGNOSIS — Z4659 Encounter for fitting and adjustment of other gastrointestinal appliance and device: Secondary | ICD-10-CM

## 2016-06-03 DIAGNOSIS — S51811A Laceration without foreign body of right forearm, initial encounter: Secondary | ICD-10-CM | POA: Diagnosis present

## 2016-06-03 DIAGNOSIS — R402222 Coma scale, best verbal response, incomprehensible words, at arrival to emergency department: Secondary | ICD-10-CM | POA: Diagnosis present

## 2016-06-03 DIAGNOSIS — R000 Tachycardia, unspecified: Secondary | ICD-10-CM

## 2016-06-03 DIAGNOSIS — S0240DA Maxillary fracture, left side, initial encounter for closed fracture: Secondary | ICD-10-CM | POA: Diagnosis present

## 2016-06-03 DIAGNOSIS — T1490 Injury, unspecified: Secondary | ICD-10-CM | POA: Diagnosis present

## 2016-06-03 DIAGNOSIS — R52 Pain, unspecified: Secondary | ICD-10-CM

## 2016-06-03 DIAGNOSIS — S0633AA Contusion and laceration of cerebrum, unspecified, with loss of consciousness status unknown, initial encounter: Secondary | ICD-10-CM

## 2016-06-03 DIAGNOSIS — D62 Acute posthemorrhagic anemia: Secondary | ICD-10-CM | POA: Diagnosis not present

## 2016-06-03 DIAGNOSIS — D696 Thrombocytopenia, unspecified: Secondary | ICD-10-CM | POA: Diagnosis not present

## 2016-06-03 DIAGNOSIS — S0101XA Laceration without foreign body of scalp, initial encounter: Secondary | ICD-10-CM | POA: Diagnosis present

## 2016-06-03 DIAGNOSIS — S0219XA Other fracture of base of skull, initial encounter for closed fracture: Secondary | ICD-10-CM | POA: Diagnosis present

## 2016-06-03 DIAGNOSIS — E872 Acidosis: Secondary | ICD-10-CM | POA: Diagnosis present

## 2016-06-03 DIAGNOSIS — S0292XA Unspecified fracture of facial bones, initial encounter for closed fracture: Secondary | ICD-10-CM | POA: Diagnosis present

## 2016-06-03 DIAGNOSIS — S52021A Displaced fracture of olecranon process without intraarticular extension of right ulna, initial encounter for closed fracture: Secondary | ICD-10-CM | POA: Diagnosis present

## 2016-06-03 DIAGNOSIS — K5900 Constipation, unspecified: Secondary | ICD-10-CM | POA: Diagnosis not present

## 2016-06-03 DIAGNOSIS — R1310 Dysphagia, unspecified: Secondary | ICD-10-CM | POA: Diagnosis not present

## 2016-06-03 DIAGNOSIS — M25529 Pain in unspecified elbow: Secondary | ICD-10-CM

## 2016-06-03 DIAGNOSIS — R402342 Coma scale, best motor response, flexion withdrawal, at arrival to emergency department: Secondary | ICD-10-CM | POA: Diagnosis present

## 2016-06-03 DIAGNOSIS — R451 Restlessness and agitation: Secondary | ICD-10-CM | POA: Diagnosis not present

## 2016-06-03 DIAGNOSIS — S069X3A Unspecified intracranial injury with loss of consciousness of 1 hour to 5 hours 59 minutes, initial encounter: Secondary | ICD-10-CM | POA: Diagnosis present

## 2016-06-03 DIAGNOSIS — R402112 Coma scale, eyes open, never, at arrival to emergency department: Secondary | ICD-10-CM | POA: Diagnosis present

## 2016-06-03 DIAGNOSIS — R4587 Impulsiveness: Secondary | ICD-10-CM | POA: Diagnosis not present

## 2016-06-03 HISTORY — DX: Other specified health status: Z78.9

## 2016-06-03 LAB — I-STAT ARTERIAL BLOOD GAS, ED
Acid-base deficit: 3 mmol/L — ABNORMAL HIGH (ref 0.0–2.0)
Bicarbonate: 21.3 mEq/L (ref 20.0–24.0)
O2 Saturation: 100 %
PCO2 ART: 36.6 mmHg (ref 35.0–45.0)
PH ART: 7.374 (ref 7.350–7.450)
TCO2: 22 mmol/L (ref 0–100)
pO2, Arterial: 528 mmHg — ABNORMAL HIGH (ref 80.0–100.0)

## 2016-06-03 LAB — CBC
HEMATOCRIT: 44.3 % (ref 39.0–52.0)
HEMOGLOBIN: 15.3 g/dL (ref 13.0–17.0)
MCH: 33.9 pg (ref 26.0–34.0)
MCHC: 34.5 g/dL (ref 30.0–36.0)
MCV: 98.2 fL (ref 78.0–100.0)
Platelets: 152 10*3/uL (ref 150–400)
RBC: 4.51 MIL/uL (ref 4.22–5.81)
RDW: 13.9 % (ref 11.5–15.5)
WBC: 12.9 10*3/uL — ABNORMAL HIGH (ref 4.0–10.5)

## 2016-06-03 LAB — COMPREHENSIVE METABOLIC PANEL
ALBUMIN: 3.8 g/dL (ref 3.5–5.0)
ALT: 50 U/L (ref 17–63)
ANION GAP: 13 (ref 5–15)
AST: 150 U/L — ABNORMAL HIGH (ref 15–41)
Alkaline Phosphatase: 60 U/L (ref 38–126)
BUN: 11 mg/dL (ref 6–20)
CHLORIDE: 104 mmol/L (ref 101–111)
CO2: 18 mmol/L — AB (ref 22–32)
Calcium: 8.1 mg/dL — ABNORMAL LOW (ref 8.9–10.3)
Creatinine, Ser: 1.37 mg/dL — ABNORMAL HIGH (ref 0.61–1.24)
GFR calc non Af Amer: >60 mL/min (ref >60)
Glucose, Bld: 117 mg/dL — ABNORMAL HIGH (ref 65–99)
Potassium: 3.3 mmol/L — ABNORMAL LOW (ref 3.5–5.1)
SODIUM: 135 mmol/L (ref 135–145)
Total Bilirubin: 0.6 mg/dL (ref 0.3–1.2)
Total Protein: 6.6 g/dL (ref 6.5–8.1)

## 2016-06-03 LAB — ETHANOL: Alcohol, Ethyl (B): 253 mg/dL — ABNORMAL HIGH (ref <5)

## 2016-06-03 LAB — PROTIME-INR
INR: 1.1
PROTHROMBIN TIME: 14.3 s (ref 11.4–15.2)

## 2016-06-03 LAB — I-STAT CG4 LACTIC ACID, ED: LACTIC ACID, VENOUS: 5.46 mmol/L — AB (ref 0.5–1.9)

## 2016-06-03 LAB — CDS SEROLOGY

## 2016-06-03 LAB — I-STAT CHEM 8, ED
BUN: 14 mg/dL (ref 6–20)
CALCIUM ION: 1.02 mmol/L — AB (ref 1.13–1.30)
Chloride: 106 mmol/L (ref 101–111)
Creatinine, Ser: 1.6 mg/dL — ABNORMAL HIGH (ref 0.61–1.24)
GLUCOSE: 117 mg/dL — AB (ref 65–99)
HCT: 46 % (ref 39.0–52.0)
HEMOGLOBIN: 15.6 g/dL (ref 13.0–17.0)
POTASSIUM: 3.6 mmol/L (ref 3.5–5.1)
SODIUM: 142 mmol/L (ref 135–145)
TCO2: 22 mmol/L (ref 0–100)

## 2016-06-03 MED ORDER — ETOMIDATE 2 MG/ML IV SOLN
INTRAVENOUS | Status: AC | PRN
  Administered 2016-06-03: 20 mg via INTRAVENOUS

## 2016-06-03 MED ORDER — PROPOFOL 1000 MG/100ML IV EMUL
INTRAVENOUS | Status: AC
  Filled 2016-06-03: qty 100

## 2016-06-03 MED ORDER — IOPAMIDOL (ISOVUE-300) INJECTION 61%
INTRAVENOUS | Status: AC
  Filled 2016-06-03: qty 100

## 2016-06-03 MED ORDER — ROCURONIUM BROMIDE 50 MG/5ML IV SOLN
INTRAVENOUS | Status: AC | PRN
  Administered 2016-06-03: 100 mg via INTRAVENOUS

## 2016-06-03 MED ORDER — FENTANYL CITRATE (PF) 100 MCG/2ML IJ SOLN
INTRAMUSCULAR | Status: AC
  Administered 2016-06-03: 50 ug via INTRAVENOUS
  Filled 2016-06-03: qty 2

## 2016-06-03 MED ORDER — PROPOFOL 1000 MG/100ML IV EMUL
5.0000 ug/kg/min | Freq: Once | INTRAVENOUS | Status: AC
  Administered 2016-06-04: 10 ug/kg/min via INTRAVENOUS

## 2016-06-03 MED ORDER — FENTANYL CITRATE (PF) 100 MCG/2ML IJ SOLN
50.0000 ug | Freq: Once | INTRAMUSCULAR | Status: AC
  Administered 2016-06-03: 50 ug via INTRAVENOUS

## 2016-06-03 MED ORDER — SUCCINYLCHOLINE CHLORIDE 20 MG/ML IJ SOLN
INTRAMUSCULAR | Status: AC | PRN
  Administered 2016-06-03: 80 mg via INTRAVENOUS
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
## 2016-06-03 NOTE — ED Notes (Signed)
Pt to CT
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
## 2016-06-03 NOTE — Procedures (Addendum)
  Preprocedure diagnosis: 2 cm laceration left temporal scalp Postprocedure diagnosis: Same Procedure: Irrigation and simple closure 2 cm laceration left temporal scalp Surgeon: Violeta GelinasBurke Davarion Cuffee, MD Procedure in detail: Emergency consent. The wound was irrigated and prepped with Betadine. It was closed in a simple fashion with staples.  Violeta GelinasBurke Ryne Mctigue, MD, MPH, FACS Trauma: [phone] General Surgery: [phone]

## 2016-06-04 ENCOUNTER — Inpatient Hospital Stay (HOSPITAL_COMMUNITY): Payer: Medicaid Other

## 2016-06-04 ENCOUNTER — Encounter (HOSPITAL_COMMUNITY): Payer: Self-pay | Admitting: Emergency Medicine

## 2016-06-04 LAB — BASIC METABOLIC PANEL
ANION GAP: 17 — AB (ref 5–15)
BUN: 13 mg/dL (ref 6–20)
CHLORIDE: 109 mmol/L (ref 101–111)
CO2: 15 mmol/L — AB (ref 22–32)
Calcium: 8.1 mg/dL — ABNORMAL LOW (ref 8.9–10.3)
Creatinine, Ser: 1.09 mg/dL (ref 0.61–1.24)
GFR calc Af Amer: >60 mL/min (ref >60)
GLUCOSE: 73 mg/dL (ref 65–99)
POTASSIUM: 3.9 mmol/L (ref 3.5–5.1)
Sodium: 141 mmol/L (ref 135–145)

## 2016-06-04 LAB — PREPARE FRESH FROZEN PLASMA
Unit division: 0
Unit division: 0

## 2016-06-04 LAB — TYPE AND SCREEN
ABO/RH(D): A NEG
Antibody Screen: NEGATIVE
Unit division: 0
Unit division: 0

## 2016-06-04 LAB — URINE MICROSCOPIC-ADD ON

## 2016-06-04 LAB — URINALYSIS, ROUTINE W REFLEX MICROSCOPIC
Bilirubin Urine: NEGATIVE
Glucose, UA: NEGATIVE mg/dL
Ketones, ur: 15 mg/dL — AB
Leukocytes, UA: NEGATIVE
Nitrite: NEGATIVE
Protein, ur: 30 mg/dL — AB
Specific Gravity, Urine: 1.025 (ref 1.005–1.030)
pH: 5.5 (ref 5.0–8.0)

## 2016-06-04 LAB — CBC
HEMATOCRIT: 39.2 % (ref 39.0–52.0)
HEMOGLOBIN: 13 g/dL (ref 13.0–17.0)
MCH: 32.5 pg (ref 26.0–34.0)
MCHC: 33.2 g/dL (ref 30.0–36.0)
MCV: 98 fL (ref 78.0–100.0)
PLATELETS: 133 10*3/uL — AB (ref 150–400)
RBC: 4 MIL/uL — AB (ref 4.22–5.81)
RDW: 13.9 % (ref 11.5–15.5)
WBC: 18.6 10*3/uL — AB (ref 4.0–10.5)

## 2016-06-04 LAB — POCT I-STAT 3, ART BLOOD GAS (G3+)
ACID-BASE DEFICIT: 10 mmol/L — AB (ref 0.0–2.0)
Bicarbonate: 15.2 meq/L — ABNORMAL LOW (ref 20.0–24.0)
O2 SAT: 99 %
PH ART: 7.292 — AB (ref 7.350–7.450)
PO2 ART: 178 mmHg — AB (ref 80.0–100.0)
TCO2: 16 mmol/L (ref 0–100)
pCO2 arterial: 31.5 mmHg — ABNORMAL LOW (ref 35.0–45.0)

## 2016-06-04 LAB — TRIGLYCERIDES: TRIGLYCERIDES: 172 mg/dL — AB (ref <150)

## 2016-06-04 LAB — ABO/RH: ABO/RH(D): A NEG

## 2016-06-04 MED ORDER — CHLORHEXIDINE GLUCONATE 0.12 % MT SOLN
15.0000 mL | Freq: Two times a day (BID) | OROMUCOSAL | Status: DC
  Administered 2016-06-04 (×2): 15 mL via OROMUCOSAL
  Filled 2016-06-04 (×2): qty 15

## 2016-06-04 MED ORDER — ORAL CARE MOUTH RINSE
15.0000 mL | Freq: Four times a day (QID) | OROMUCOSAL | Status: DC
  Administered 2016-06-04 – 2016-06-07 (×14): 15 mL via OROMUCOSAL

## 2016-06-04 MED ORDER — SODIUM CHLORIDE 0.9 % IV SOLN: INTRAVENOUS | Status: DC | PRN

## 2016-06-04 MED ORDER — POTASSIUM CHLORIDE IN NACL 20-0.9 MEQ/L-% IV SOLN
INTRAVENOUS | Status: DC
Start: 2016-06-04 — End: 2016-06-12
  Administered 2016-06-04 – 2016-06-10 (×13): via INTRAVENOUS
  Administered 2016-06-11: 100 mL/h via INTRAVENOUS
  Filled 2016-06-04 (×23): qty 1000

## 2016-06-04 MED ORDER — ONDANSETRON HCL 4 MG PO TABS: 4.0000 mg | ORAL_TABLET | Freq: Four times a day (QID) | ORAL | Status: DC | PRN

## 2016-06-04 MED ORDER — FENTANYL BOLUS VIA INFUSION
50.0000 ug | INTRAVENOUS | Status: DC | PRN
  Administered 2016-06-04: 50 ug via INTRAVENOUS
  Filled 2016-06-04: qty 50

## 2016-06-04 MED ORDER — FENTANYL CITRATE (PF) 100 MCG/2ML IJ SOLN
50.0000 ug | Freq: Once | INTRAMUSCULAR | Status: AC
  Administered 2016-06-04: 50 ug via INTRAVENOUS

## 2016-06-04 MED ORDER — PROPOFOL 1000 MG/100ML IV EMUL
0.0000 ug/kg/min | INTRAVENOUS | Status: DC
  Administered 2016-06-04: 10 ug/kg/min via INTRAVENOUS
  Administered 2016-06-04: 50 ug/kg/min via INTRAVENOUS
  Administered 2016-06-05: 30 ug/kg/min via INTRAVENOUS
  Administered 2016-06-05: 10 ug/kg/min via INTRAVENOUS
  Administered 2016-06-06: 15 ug/kg/min via INTRAVENOUS
  Filled 2016-06-04 (×4): qty 100

## 2016-06-04 MED ORDER — CEFAZOLIN IN D5W 1 GM/50ML IV SOLN
1.0000 g | Freq: Three times a day (TID) | INTRAVENOUS | Status: DC
  Administered 2016-06-04 – 2016-06-12 (×26): 1 g via INTRAVENOUS
  Filled 2016-06-04 (×32): qty 50

## 2016-06-04 MED ORDER — PHENYLEPHRINE HCL 10 MG/ML IJ SOLN
0.0000 ug/min | INTRAVENOUS | Status: DC
  Administered 2016-06-04: 20 ug/min via INTRAVENOUS
  Administered 2016-06-04: 10 ug/min via INTRAVENOUS
  Filled 2016-06-04 (×3): qty 1

## 2016-06-04 MED ORDER — ONDANSETRON HCL 4 MG/2ML IJ SOLN: 4.0000 mg | Freq: Four times a day (QID) | INTRAMUSCULAR | Status: DC | PRN

## 2016-06-04 MED ORDER — CHLORHEXIDINE GLUCONATE 0.12 % MT SOLN
15.0000 mL | Freq: Two times a day (BID) | OROMUCOSAL | Status: DC
  Administered 2016-06-04 – 2016-06-07 (×7): 15 mL via OROMUCOSAL
  Filled 2016-06-04 (×5): qty 15

## 2016-06-04 MED ORDER — ORAL CARE MOUTH RINSE
15.0000 mL | Freq: Four times a day (QID) | OROMUCOSAL | Status: DC
Start: 2016-06-04 — End: 2016-06-05
  Administered 2016-06-04 – 2016-06-05 (×5): 15 mL via OROMUCOSAL

## 2016-06-04 MED ORDER — FAMOTIDINE IN NACL 20-0.9 MG/50ML-% IV SOLN
20.0000 mg | Freq: Two times a day (BID) | INTRAVENOUS | Status: DC
  Administered 2016-06-04 – 2016-06-09 (×11): 20 mg via INTRAVENOUS
  Filled 2016-06-04 (×11): qty 50

## 2016-06-04 MED ORDER — SODIUM CHLORIDE 0.9 % IV SOLN
25.0000 ug/h | INTRAVENOUS | Status: DC
  Administered 2016-06-04: 50 ug/h via INTRAVENOUS
  Administered 2016-06-04 – 2016-06-05 (×2): 100 ug/h via INTRAVENOUS
  Filled 2016-06-04 (×3): qty 50

## 2016-06-04 NOTE — ED Notes (Signed)
Dr Lovell Sheehanjenkins has called in and will see in a few minutes  He asked that the sedation be decreased  The pt was just bolused with 10mg  of proprfol  For attempting to get out of bed

## 2016-06-04 NOTE — Progress Notes (Signed)
I spoke with the patient's mother, father and sister and updated them on his condition.

## 2016-06-04 NOTE — ED Notes (Signed)
Pt awakened and was moving al extremities and attempting to get off the bed  bolused with 10mg  of propofol

## 2016-06-04 NOTE — ED Notes (Signed)
To 415m rm 9
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
## 2016-06-04 NOTE — ED Notes (Signed)
[facility]bel[facility]ings [facility] to [facility] [employer] [facility] [employer] [facility].

## 2016-06-04 NOTE — ED Notes (Signed)
Pt awakened aster propofol was stopped so dr Lamar Blinksjuenkins could examine the pt  The it was started again and a 10 mg bolus was given.  Pt moving all extremities
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
## 2016-06-04 NOTE — Procedures (Signed)
Arterial Catheter Insertion Procedure Note Travis CharterDenzel L Mcguire [id number] 12/13/1995  Procedure: Insertion of Arterial Catheter  Indications: Blood pressure monitoring  Procedure Details Consent: Risks of procedure as well as the alternatives and risks of each were explained to the (patient/caregiver).  Consent for procedure obtained. Time Out: Verified patient identification, verified procedure, site/side was marked, verified correct patient position, special equipment/implants available, medications/allergies/relevent history reviewed, required imaging and test results available.  Performed  Maximum sterile technique was used including antiseptics, cap, gloves, gown, hand hygiene, mask and sheet. Skin prep: Chlorhexidine; local anesthetic administered 20 gauge catheter was inserted into right radial artery using the Seldinger technique.  Evaluation Blood flow good; BP tracing good. Complications: No apparent complications.   Travis Mcguire, Travis GellJohn P 06/04/2016
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
## 2016-06-05 ENCOUNTER — Encounter (HOSPITAL_COMMUNITY): Payer: Self-pay

## 2016-06-05 ENCOUNTER — Inpatient Hospital Stay (HOSPITAL_COMMUNITY): Payer: Medicaid Other

## 2016-06-05 LAB — BASIC METABOLIC PANEL
Anion gap: 8 (ref 5–15)
BUN: 13 mg/dL (ref 6–20)
CO2: 22 mmol/L (ref 22–32)
Calcium: 8 mg/dL — ABNORMAL LOW (ref 8.9–10.3)
Chloride: 110 mmol/L (ref 101–111)
Creatinine, Ser: 1.12 mg/dL (ref 0.61–1.24)
GFR calc Af Amer: >60 mL/min (ref >60)
GLUCOSE: 107 mg/dL — AB (ref 65–99)
POTASSIUM: 4.4 mmol/L (ref 3.5–5.1)
SODIUM: 140 mmol/L (ref 135–145)

## 2016-06-05 LAB — CBC
HCT: 26.2 % — ABNORMAL LOW (ref 39.0–52.0)
HEMOGLOBIN: 8.9 g/dL — AB (ref 13.0–17.0)
MCH: 32.7 pg (ref 26.0–34.0)
MCHC: 34 g/dL (ref 30.0–36.0)
MCV: 96.3 fL (ref 78.0–100.0)
PLATELETS: 71 10*3/uL — AB (ref 150–400)
RBC: 2.72 MIL/uL — AB (ref 4.22–5.81)
RDW: 13.8 % (ref 11.5–15.5)
WBC: 10 10*3/uL (ref 4.0–10.5)

## 2016-06-05 LAB — MAGNESIUM
MAGNESIUM: 1.9 mg/dL (ref 1.7–2.4)
Magnesium: 1.8 mg/dL (ref 1.7–2.4)

## 2016-06-05 LAB — GLUCOSE, CAPILLARY
GLUCOSE-CAPILLARY: 119 mg/dL — AB (ref 65–99)
Glucose-Capillary: 118 mg/dL — ABNORMAL HIGH (ref 65–99)

## 2016-06-05 LAB — PHOSPHORUS
PHOSPHORUS: 2.2 mg/dL — AB (ref 2.5–4.6)
PHOSPHORUS: 2.3 mg/dL — AB (ref 2.5–4.6)

## 2016-06-05 MED ORDER — PRO-STAT SUGAR FREE PO LIQD: 30.0000 mL | Freq: Every day | ORAL | Status: DC

## 2016-06-05 MED ORDER — VITAL HIGH PROTEIN PO LIQD: 1000.0000 mL | ORAL | Status: DC

## 2016-06-05 MED ORDER — PRO-STAT SUGAR FREE PO LIQD
30.0000 mL | Freq: Two times a day (BID) | ORAL | Status: DC
Start: 2016-06-05 — End: 2016-06-05
  Administered 2016-06-05: 30 mL
  Filled 2016-06-05: qty 30

## 2016-06-05 MED ORDER — VITAMIN C 500 MG PO TABS
1000.0000 mg | ORAL_TABLET | Freq: Three times a day (TID) | ORAL | Status: DC
  Administered 2016-06-05 – 2016-06-11 (×10): 1000 mg
  Filled 2016-06-05 (×11): qty 2

## 2016-06-05 MED ORDER — QUETIAPINE FUMARATE 25 MG PO TABS
25.0000 mg | ORAL_TABLET | Freq: Two times a day (BID) | ORAL | Status: DC
  Administered 2016-06-05 – 2016-06-12 (×11): 25 mg
  Filled 2016-06-05 (×15): qty 1

## 2016-06-05 MED ORDER — SELENIUM 50 MCG PO TABS
200.0000 ug | ORAL_TABLET | Freq: Every day | ORAL | Status: DC
  Administered 2016-06-05 – 2016-06-11 (×4): 200 ug
  Filled 2016-06-05 (×7): qty 4

## 2016-06-05 MED ORDER — PIVOT 1.5 CAL PO LIQD
1000.0000 mL | ORAL | Status: DC
  Administered 2016-06-05: 1000 mL
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
## 2016-06-05 NOTE — Progress Notes (Signed)
Orthopedic Tech Progress Note Patient Details:  Travis Mcguire [birth year] [id number]  Ortho Devices Type of Ortho Device: Ace wrap, Long arm splint Ortho Device/Splint Location: rue Ortho Device/Splint Interventions: Application   Travis Mcguire, Travis Mcguire 06/05/2016, 2:31 PM Viewed order from doctor's order list
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
## 2016-06-06 LAB — GLUCOSE, CAPILLARY
GLUCOSE-CAPILLARY: 105 mg/dL — AB (ref 65–99)
GLUCOSE-CAPILLARY: 105 mg/dL — AB (ref 65–99)
Glucose-Capillary: 126 mg/dL — ABNORMAL HIGH (ref 65–99)
Glucose-Capillary: 121 mg/dL — ABNORMAL HIGH (ref 65–99)
Glucose-Capillary: 107 mg/dL — ABNORMAL HIGH (ref 65–99)
Glucose-Capillary: 103 mg/dL — ABNORMAL HIGH (ref 65–99)

## 2016-06-06 LAB — MAGNESIUM
MAGNESIUM: 1.9 mg/dL (ref 1.7–2.4)
Magnesium: 2 mg/dL (ref 1.7–2.4)

## 2016-06-06 LAB — CBC
HEMATOCRIT: 25.7 % — AB (ref 39.0–52.0)
HEMOGLOBIN: 8.7 g/dL — AB (ref 13.0–17.0)
MCH: 33 pg (ref 26.0–34.0)
MCHC: 33.9 g/dL (ref 30.0–36.0)
MCV: 97.3 fL (ref 78.0–100.0)
Platelets: 62 10*3/uL — ABNORMAL LOW (ref 150–400)
RBC: 2.64 MIL/uL — AB (ref 4.22–5.81)
RDW: 13.3 % (ref 11.5–15.5)
WBC: 7.3 10*3/uL (ref 4.0–10.5)

## 2016-06-06 LAB — BASIC METABOLIC PANEL
ANION GAP: 5 (ref 5–15)
BUN: 10 mg/dL (ref 6–20)
CALCIUM: 8 mg/dL — AB (ref 8.9–10.3)
CO2: 26 mmol/L (ref 22–32)
CREATININE: 0.93 mg/dL (ref 0.61–1.24)
Chloride: 108 mmol/L (ref 101–111)
GFR calc Af Amer: >60 mL/min (ref >60)
GLUCOSE: 109 mg/dL — AB (ref 65–99)
POTASSIUM: 3.5 mmol/L (ref 3.5–5.1)
Sodium: 139 mmol/L (ref 135–145)

## 2016-06-06 LAB — PHOSPHORUS
Phosphorus: 1.8 mg/dL — ABNORMAL LOW (ref 2.5–4.6)
Phosphorus: 3.3 mg/dL (ref 2.5–4.6)

## 2016-06-06 MED ORDER — IOPAMIDOL (ISOVUE-300) INJECTION 61%
100.0000 mL | Freq: Once | INTRAVENOUS | Status: AC | PRN
  Administered 2016-06-03: 100 mL via INTRAVENOUS

## 2016-06-06 MED ORDER — FENTANYL CITRATE (PF) 100 MCG/2ML IJ SOLN
25.0000 ug | INTRAMUSCULAR | Status: DC | PRN
  Administered 2016-06-06 – 2016-06-08 (×7): 50 ug via INTRAVENOUS
  Filled 2016-06-06 (×7): qty 2

## 2016-06-06 MED ORDER — HALOPERIDOL LACTATE 5 MG/ML IJ SOLN
5.0000 mg | Freq: Four times a day (QID) | INTRAMUSCULAR | Status: DC | PRN
  Administered 2016-06-06 – 2016-06-12 (×11): 5 mg via INTRAVENOUS
  Filled 2016-06-06 (×12): qty 1

## 2016-06-06 MED ORDER — DEXMEDETOMIDINE HCL IN NACL 200 MCG/50ML IV SOLN
0.4000 ug/kg/h | INTRAVENOUS | Status: DC
  Administered 2016-06-06: 0.8 ug/kg/h via INTRAVENOUS
  Administered 2016-06-06: 1 ug/kg/h via INTRAVENOUS
  Administered 2016-06-06: 0.6 ug/kg/h via INTRAVENOUS
  Administered 2016-06-06: 0.7 ug/kg/h via INTRAVENOUS
  Filled 2016-06-06 (×4): qty 50

## 2016-06-06 MED ORDER — LORAZEPAM 2 MG/ML IJ SOLN
INTRAMUSCULAR | Status: AC
  Administered 2016-06-06: 1 mg via INTRAVENOUS
  Filled 2016-06-06: qty 1

## 2016-06-06 MED ORDER — HALOPERIDOL LACTATE 5 MG/ML IJ SOLN
INTRAMUSCULAR | Status: AC
  Administered 2016-06-06: 5 mg
  Filled 2016-06-06: qty 1

## 2016-06-06 MED ORDER — NALOXONE HCL 0.4 MG/ML IJ SOLN
INTRAMUSCULAR | Status: AC
  Administered 2016-06-06: 0.2 mg
  Filled 2016-06-06: qty 1

## 2016-06-06 MED ORDER — SODIUM CHLORIDE 0.9 % IV SOLN
25.0000 ug/h | INTRAVENOUS | Status: DC
  Administered 2016-06-06: 10 ug/h via INTRAVENOUS
  Filled 2016-06-06: qty 50

## 2016-06-06 MED ORDER — LORAZEPAM 2 MG/ML IJ SOLN
1.0000 mg | Freq: Once | INTRAMUSCULAR | Status: AC
Start: 2016-06-06 — End: 2016-06-06
  Administered 2016-06-06: 1 mg via INTRAVENOUS
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
## 2016-06-06 NOTE — Procedures (Signed)
Extubation Procedure Note  Patient Details:   Name: Travis Mcguire DOB: Apr 11, 1996 MRN: [id number]   Airway Documentation:     Evaluation  O2 sats: stable throughout Complications: No apparent complications Patient did tolerate procedure well. Bilateral Breath Sounds: Rhonchi, Diminished   Yes  Morley KosJohnson, Minard Millirons Leroy 06/06/2016, 10:58 AM
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
## 2016-06-06 NOTE — Progress Notes (Signed)
Remainder of fentanyl drip (120cc) wasted in sink. Thomes CakeMari Furr RN as witness.
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
## 2016-06-06 NOTE — Progress Notes (Signed)
OT Cancellation Note  Patient Details Name: Travis CharterDenzel L Teachey MRN: [id number] DOB: 29-Mar-1996   Cancelled Treatment:    Reason Eval/Treat Not Completed: Patient not medically ready OT aware of splint needs. Pt currently extubated and extremely restless [place] state. Pt is unsafe to attempt splint needs at this time.   Felecia ShellingJones, Daton Szilagyi B   Jeriah Skufca, Brynn   OTR/L Pager: [phone] Office: [phone] .  06/06/2016, 1:57 PM

## 2016-06-07 LAB — GLUCOSE, CAPILLARY
GLUCOSE-CAPILLARY: 113 mg/dL — AB (ref 65–99)
GLUCOSE-CAPILLARY: 108 mg/dL — AB (ref 65–99)
Glucose-Capillary: 107 mg/dL — ABNORMAL HIGH (ref 65–99)
Glucose-Capillary: 96 mg/dL (ref 65–99)
Glucose-Capillary: 107 mg/dL — ABNORMAL HIGH (ref 65–99)

## 2016-06-07 LAB — BASIC METABOLIC PANEL
Anion gap: 5 (ref 5–15)
BUN: 8 mg/dL (ref 6–20)
CHLORIDE: 107 mmol/L (ref 101–111)
CO2: 28 mmol/L (ref 22–32)
Calcium: 8.5 mg/dL — ABNORMAL LOW (ref 8.9–10.3)
Creatinine, Ser: 0.76 mg/dL (ref 0.61–1.24)
GFR calc Af Amer: >60 mL/min (ref >60)
GFR calc non Af Amer: >60 mL/min (ref >60)
GLUCOSE: 101 mg/dL — AB (ref 65–99)
POTASSIUM: 4.3 mmol/L (ref 3.5–5.1)
Sodium: 140 mmol/L (ref 135–145)

## 2016-06-07 LAB — CBC
HEMATOCRIT: 22.3 % — AB (ref 39.0–52.0)
Hemoglobin: 7.6 g/dL — ABNORMAL LOW (ref 13.0–17.0)
MCH: 32.8 pg (ref 26.0–34.0)
MCHC: 34.1 g/dL (ref 30.0–36.0)
MCV: 96.1 fL (ref 78.0–100.0)
Platelets: 65 10*3/uL — ABNORMAL LOW (ref 150–400)
RBC: 2.32 MIL/uL — ABNORMAL LOW (ref 4.22–5.81)
RDW: 12.9 % (ref 11.5–15.5)
WBC: 7.3 10*3/uL (ref 4.0–10.5)

## 2016-06-07 LAB — TRIGLYCERIDES: Triglycerides: 84 mg/dL (ref <150)

## 2016-06-07 MED ORDER — DEXMEDETOMIDINE HCL IN NACL 400 MCG/100ML IV SOLN
0.4000 ug/kg/h | INTRAVENOUS | Status: DC
  Administered 2016-06-06: 0.7 ug/kg/h via INTRAVENOUS
  Administered 2016-06-07: 1 ug/kg/h via INTRAVENOUS
  Administered 2016-06-07: 0.9 ug/kg/h via INTRAVENOUS
  Administered 2016-06-07: 0.5 ug/kg/h via INTRAVENOUS
  Administered 2016-06-08: 0.8 ug/kg/h via INTRAVENOUS
  Administered 2016-06-08 (×2): 1 ug/kg/h via INTRAVENOUS
  Administered 2016-06-09: 0.5 ug/kg/h via INTRAVENOUS
  Administered 2016-06-09: 1 ug/kg/h via INTRAVENOUS
  Administered 2016-06-09: 0.8 ug/kg/h via INTRAVENOUS
  Administered 2016-06-09: 1 ug/kg/h via INTRAVENOUS
  Filled 2016-06-07 (×11): qty 100

## 2016-06-07 MED ORDER — ORAL CARE MOUTH RINSE
15.0000 mL | Freq: Two times a day (BID) | OROMUCOSAL | Status: DC
  Administered 2016-06-07 – 2016-06-12 (×7): 15 mL via OROMUCOSAL

## 2016-06-07 MED ORDER — SODIUM CHLORIDE 0.9% FLUSH: 10.0000 mL | INTRAVENOUS | Status: DC | PRN

## 2016-06-07 MED ORDER — SODIUM CHLORIDE 0.9% FLUSH
10.0000 mL | Freq: Two times a day (BID) | INTRAVENOUS | Status: DC
  Administered 2016-06-07 – 2016-06-10 (×6): 10 mL
  Administered 2016-06-11: 20 mL
  Administered 2016-06-11 – 2016-06-12 (×2): 10 mL
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
## 2016-06-07 NOTE — Progress Notes (Signed)
Peripherally Inserted Central Catheter/Midline Placement  The IV Nurse has discussed with the patient and/or persons authorized to consent for the patient, the purpose of this procedure and the potential benefits and risks involved with this procedure.  The benefits include less needle sticks, lab draws from the catheter and patient may be discharged home with the catheter.  Risks include, but not limited to, infection, bleeding, blood clot (thrombus formation), and puncture of an artery; nerve damage and irregular heat beat.  Alternatives to this procedure were also discussed.  PICC/Midline Placement Documentation     Attempted to call both number unable to reach mom, doctor gave medical necessity.   Travis Mcguire, Travis Mcguire 06/07/2016, 8:28 AM

## 2016-06-07 NOTE — Progress Notes (Signed)
06/07/2016  COMA RECOVERY Rancho Levels I-VI of Cognitive Functioning-Daily Tracking Sheet         Date of Onset __8/27_______  Level of function Behavioral Characteristics Initial Eval.  Date: _8/31/17 Date and initial when observed   Level I No response Total Assistance Complete absence of observable change in behavior when presented visual, auditory, tactile, proprioceptive, vestibular or painful stimuli.             Level II Generalized response  Total Assistance Demonstrates generalized reflex response to painful stimuli 06/07/16 RBM      Responds to repeated auditory stimuli with increased or decreased activity       Responds to external stimuli with physiological changes generalized, gross body movement and / or not purposeful vocalization  06/07/16 RBM             Responses noted above may be same regardless of type and location of stimuli  06/07/16 RBM      Responses may be significantly delayed 06/07/16 RBM     Level III Localized response  Total Assistance Demonstrates withdrawal or vocalization to painful stimuli       Turns toward or away from auditory stimuli        Blinks when strong light crosses visual field        Follows moving object passed within visual field        Responds to discomfort by pulling tubes or restraints 06/07/16 RBM      Responds inconsistently to simple commands       Responses directly related to type of stimulus       May respond to some persons (especially friends and family) but not to others       Level IV Confused/Agitated  Maximal Assistance       Alert and in heightened state of anxiety   HW 9/1     Purposeful attempts to remove restraints or tubes or crawl out of bed  8/31 [facility]      May perform motor activities such as sitting, reaching and walking without any apparent purpose or upon another's request  8/31 RBM      Brief and usually non purposeful moments of focused and sustained attention  9/1 HW      Post traumatic amnesia state        Absent goal directed, problem solving, self monitoring behavior  [facility]     May cry or scream out of proportion to stimulus even after it's removal       May exhibit aggressive or flight behavior        Mood swing from euphoric to hostile with no apparent relationship to environmental events        Verbalizations are frequently incoherent and/or inappropriate to activity or environment  06/07/16 RBM           Level V   Confused/InappropriateNon-Agitated  Maximal Assistance Alert, not agitated but may wander randomly or with vague intention of going home    [facility]11[facility]16 [facility]    May become agitated in response to external stimulation and/or lack of environmental structure.       Not orientated to person, place and time.       Frequent brief periods, non-purposeful sustained attention.       Severely impaired recent memory, with confusion of past and present in reaction to ongoing activity.    06/11/16 RBM    Absent goal directed, problem solving behavior.  Often demonstrates inappropriate use of objects without external direction.   06/11/16 RBM    May be able to perform previously learned tasks when structure and cues provided.   06/11/16 RBM    Unable to learn new information       Able to respond appropriately to simple commands fairly consistently with external structure and cues.   06/11/16 RBM    Responses to simple commands without external structure are random and non-purposeful in relation to the command       Able to converse on a social, automatic level for brief periods of time when provided external structure and cues.        Verbalizations about present events become inappropriate and confabulatory when external structure and cues are not provided.                Level of function Level VI Confused/Appropriate  Maximal Assistance Behavioral Characteristics Initial Eval.  Date: _____ Date and initial when observed     Able to attend to highly familiar tasks in non-distracting environment for 30 minutes with moderate redirection.       Remote memory has more depth and detail than recent memory        Vague recognition of some staff.       Able to use assistive memory aide with maximal assist.       Emerging awareness of appropriate response to self, family and basic needs.        Emerging goal directed behavior.        Moderate assist to problem solve barriers to task completion.        Supervised for old learning (e.g. self care).       Shows carry over for relearned familiar tasks (e.g. self care).       Maximal assistance for new learning with little or no carry over.       Unaware of impairments, disabilities and safety risks.       Consistently follows simple directions       Verbal expressions are appropriate in highly familiar and structured situations.       DO  NOT SIGN NOTE. ALWAYS SHARE UNLESS PATIENT HAS PROGRESSED BEYOND A RANCHO LEVEL VI. THIS WAY, ALL TEAM MEMBERS HAVE ACCESS TO DOCUMENT ON TRACKING SHEET. THANK YOU.
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
## 2016-06-07 NOTE — Progress Notes (Signed)
Patient ID: Ophelia CharterDenzel L Karapetian, male   DOB: 06/16/1996, 20 y.o.   MRN: [id number] Placement of a PICC line is medically necessary for management of his injuries. Violeta GelinasBurke Shaneice Barsanti, MD, MPH, FACS Trauma: [phone] General Surgery: [phone]

## 2016-06-07 NOTE — Progress Notes (Signed)
Patient ID: Travis Mcguire, male   DOB: 10-22-1995, 20 y.o.   MRN: [id number] Subjective:  The patient has been extubated. He is agitated. He will mumble some.  Objective: Vital signs in last 24 hours: Temp:  [95.2 F (35.1 C)-101.3 F (38.5 C)] 97.7 F (36.5 C) (08/31 1200) Pulse Rate:  [54-139] 63 (08/31 1200) Resp:  [13-24] 20 (08/31 1200) BP: (100-147)/(52-87) 146/70 (08/31 1200) SpO2:  [99 %-100 %] 100 % (08/31 1200) FiO2 (%):  [40 %-45 %] 40 % (08/31 0800) Weight:  [63.4 kg (139 lb 12.4 oz)] 63.4 kg (139 lb 12.4 oz) (08/31 0500)  Intake/Output from previous day: 08/30 0701 - 08/31 0700 In: 2781.1 [I.V.:2494.9; NG/GT:86.3; IV Piggyback:200] Out: 2445 [Urine:2445] Intake/Output this shift: Total I/O In: 212.9 [I.V.:112.9; IV Piggyback:100] Out: 800 [Urine:800]  Physical exam the patient is Glasgow Coma Scale 12, E4M6V2 he moves all 4 extremities well. His pupils are equal.  Lab Results:  Recent Labs  06/06/16 0557 06/07/16 0719  WBC 7.3 7.3  HGB 8.7* 7.6*  HCT 25.7* 22.3*  PLT 62* 65*   BMET  Recent Labs  06/06/16 0557 06/07/16 0719  NA 139 140  K 3.5 4.3  CL 108 107  CO2 26 28  GLUCOSE 109* 101*  BUN 10 8  CREATININE 0.93 0.76  CALCIUM 8.0* 8.5*    Studies/Results: No results found.  Assessment/Plan: Traumatic brain injury, subdural hematoma, cerebral contusions: The patient is doing well clinically. It looks like he'll need rehabilitation.  LOS: 4 days     [name] D 06/07/2016, 1:24 PM

## 2016-06-07 NOTE — Progress Notes (Signed)
150mL of fentanyl (8510mcg/mL) rinsed down sink by this RN and Desiree LucyBethany Culbertson RN.

## 2016-06-07 NOTE — Progress Notes (Signed)
SLP Cancellation Note  Patient Details Name: Travis Mcguire MRN: [id number] DOB: 20-Jun-1996   Cancelled treatment:       Reason Eval/Treat Not Completed: Fatigue/lethargy limiting ability to participate. Per RN requiring large amounts of sedation due to agitation (sounds like close to a Rancho Level IV based on conversation). PICC line placed and planning to attempt to decrease sedation today. No alert enough at this time for swallow evaluation. Recommend SLP evaluation as part of TBI team if sedation can be lowered this afternoon.   Ferdinand LangoLeah Jlon Betker MA, CCC-SLP [phone]    [name] 06/07/2016, 8:59 AM

## 2016-06-08 LAB — BASIC METABOLIC PANEL
ANION GAP: 8 (ref 5–15)
BUN: 10 mg/dL (ref 6–20)
CALCIUM: 8.4 mg/dL — AB (ref 8.9–10.3)
CHLORIDE: 105 mmol/L (ref 101–111)
CO2: 27 mmol/L (ref 22–32)
Creatinine, Ser: 0.84 mg/dL (ref 0.61–1.24)
GFR calc non Af Amer: >60 mL/min (ref >60)
GLUCOSE: 96 mg/dL (ref 65–99)
POTASSIUM: 3.6 mmol/L (ref 3.5–5.1)
Sodium: 140 mmol/L (ref 135–145)

## 2016-06-08 LAB — GLUCOSE, CAPILLARY
GLUCOSE-CAPILLARY: 110 mg/dL — AB (ref 65–99)
Glucose-Capillary: 103 mg/dL — ABNORMAL HIGH (ref 65–99)
Glucose-Capillary: 106 mg/dL — ABNORMAL HIGH (ref 65–99)
Glucose-Capillary: 112 mg/dL — ABNORMAL HIGH (ref 65–99)
Glucose-Capillary: 113 mg/dL — ABNORMAL HIGH (ref 65–99)
Glucose-Capillary: 94 mg/dL (ref 65–99)

## 2016-06-08 LAB — CBC
HEMATOCRIT: 21.6 % — AB (ref 39.0–52.0)
HEMOGLOBIN: 7.5 g/dL — AB (ref 13.0–17.0)
MCH: 32.9 pg (ref 26.0–34.0)
MCHC: 34.7 g/dL (ref 30.0–36.0)
MCV: 94.7 fL (ref 78.0–100.0)
Platelets: 84 10*3/uL — ABNORMAL LOW (ref 150–400)
RBC: 2.28 MIL/uL — AB (ref 4.22–5.81)
RDW: 12.6 % (ref 11.5–15.5)
WBC: 7.1 10*3/uL (ref 4.0–10.5)

## 2016-06-08 MED ORDER — ENSURE ENLIVE PO LIQD
237.0000 mL | Freq: Two times a day (BID) | ORAL | Status: DC
  Administered 2016-06-08 – 2016-06-11 (×5): 237 mL via ORAL

## 2016-06-08 MED ORDER — FENTANYL CITRATE (PF) 100 MCG/2ML IJ SOLN
25.0000 ug | INTRAMUSCULAR | Status: DC | PRN
  Administered 2016-06-08 (×3): 100 ug via INTRAVENOUS
  Administered 2016-06-08: 50 ug via INTRAVENOUS
  Administered 2016-06-08 – 2016-06-11 (×14): 100 ug via INTRAVENOUS
  Administered 2016-06-11: 25 ug via INTRAVENOUS
  Administered 2016-06-11 – 2016-06-12 (×3): 100 ug via INTRAVENOUS
  Filled 2016-06-08 (×23): qty 2
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
## 2016-06-08 NOTE — Progress Notes (Signed)
Spoke with CCM regarding pt rhythm, pt has been stable in sinus rhythm or sinus brady without any runs of v-tach for 30 minutes will continue to monitor. Patient has otherwise stable exam.

## 2016-06-08 NOTE — Progress Notes (Signed)
Subjective: Patient reports sedated, sleeping soundly  Objective: Vital signs in last 24 hours: Temp:  [97.4 F (36.3 C)-98.3 F (36.8 C)] 97.5 F (36.4 C) (09/01 0800) Pulse Rate:  [61-105] 67 (09/01 0900) Resp:  [16-26] 24 (09/01 0900) BP: (108-160)/(60-124) 150/85 (09/01 0900) SpO2:  [80 %-100 %] 98 % (09/01 0900) Weight:  [64.9 kg (143 lb 1.3 oz)] 64.9 kg (143 lb 1.3 oz) (09/01 0600)  Intake/Output from previous day: 08/31 0701 - 09/01 0700 In: 2801.9 [I.V.:2551.9; IV Piggyback:250] Out: 900 [Urine:900] Intake/Output this shift: Total I/O In: 112.6 [I.V.:112.6] Out: 400 [Urine:400]  Currently sedated & sleeping soundly. PEARL Reports from nursing reveal improved responsiveness when off sedation, specifically responsive to family. Answers questions by repeating questions.    Lab Results:  Recent Labs  06/07/16 0719 06/08/16 0555  WBC 7.3 7.1  HGB 7.6* 7.5*  HCT 22.3* 21.6*  PLT 65* 84*   BMET  Recent Labs  06/07/16 0719 06/08/16 0555  NA 140 140  K 4.3 3.6  CL 107 105  CO2 28 27  GLUCOSE 101* 96  BUN 8 10  CREATININE 0.76 0.84  CALCIUM 8.5* 8.4*    Studies/Results: No results found.  Assessment/Plan:   LOS: 5 days  Observation with plan for CIR when medically appropriate   Marlee Trentman, Arlys JohnBrian 06/08/2016, 9:22 AM

## 2016-06-08 NOTE — Progress Notes (Addendum)
Rehab Admissions Coordinator Note:  Patient was screened by Clois DupesBoyette, Isbella Arline Godwin for appropriateness for an Inpatient Acute Rehab Consult per PT and OT recommendations.  At this time, we are recommending an inpt rehab consult.  Clois DupesBoyette, Noelene Gang Godwin 06/08/2016, 8:05 AM  I can be reached at [phone].
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
## 2016-06-09 LAB — CBC
HCT: 23 % — ABNORMAL LOW (ref 39.0–52.0)
HEMOGLOBIN: 7.9 g/dL — AB (ref 13.0–17.0)
MCH: 32.2 pg (ref 26.0–34.0)
MCHC: 34.3 g/dL (ref 30.0–36.0)
MCV: 93.9 fL (ref 78.0–100.0)
PLATELETS: 111 10*3/uL — AB (ref 150–400)
RBC: 2.45 MIL/uL — AB (ref 4.22–5.81)
RDW: 12.5 % (ref 11.5–15.5)
WBC: 6.7 10*3/uL (ref 4.0–10.5)

## 2016-06-09 LAB — BASIC METABOLIC PANEL
Anion gap: 8 (ref 5–15)
BUN: 7 mg/dL (ref 6–20)
CHLORIDE: 106 mmol/L (ref 101–111)
CO2: 25 mmol/L (ref 22–32)
CREATININE: 0.77 mg/dL (ref 0.61–1.24)
Calcium: 8.6 mg/dL — ABNORMAL LOW (ref 8.9–10.3)
Glucose, Bld: 104 mg/dL — ABNORMAL HIGH (ref 65–99)
POTASSIUM: 3.6 mmol/L (ref 3.5–5.1)
SODIUM: 139 mmol/L (ref 135–145)

## 2016-06-09 LAB — GLUCOSE, CAPILLARY
GLUCOSE-CAPILLARY: 101 mg/dL — AB (ref 65–99)
GLUCOSE-CAPILLARY: 109 mg/dL — AB (ref 65–99)
GLUCOSE-CAPILLARY: 116 mg/dL — AB (ref 65–99)
GLUCOSE-CAPILLARY: 116 mg/dL — AB (ref 65–99)
GLUCOSE-CAPILLARY: 122 mg/dL — AB (ref 65–99)
Glucose-Capillary: 138 mg/dL — ABNORMAL HIGH (ref 65–99)

## 2016-06-09 MED ORDER — FAMOTIDINE IN NACL 20-0.9 MG/50ML-% IV SOLN
20.0000 mg | Freq: Two times a day (BID) | INTRAVENOUS | Status: DC
  Administered 2016-06-09 – 2016-06-10 (×2): 20 mg via INTRAVENOUS
  Filled 2016-06-09 (×2): qty 50

## 2016-06-09 MED ORDER — ENOXAPARIN SODIUM 40 MG/0.4ML ~~LOC~~ SOLN
40.0000 mg | SUBCUTANEOUS | Status: DC
  Administered 2016-06-09 – 2016-06-12 (×4): 40 mg via SUBCUTANEOUS
  Filled 2016-06-09 (×4): qty 0.4

## 2016-06-09 MED ORDER — FAMOTIDINE 40 MG/5ML PO SUSR: 20.0000 mg | Freq: Two times a day (BID) | ORAL | Status: DC
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
## 2016-06-09 NOTE — Progress Notes (Signed)
Subjective: Patient reports sleeping  Objective: Vital signs in last 24 hours: Temp:  [97.2 F (36.2 C)-97.6 F (36.4 C)] 97.6 F (36.4 C) (09/02 0800) Pulse Rate:  [51-105] 55 (09/02 0800) Resp:  [14-27] 24 (09/02 0800) BP: (123-164)/(60-104) 142/60 (09/02 0800) SpO2:  [92 %-100 %] 100 % (09/02 0800) Weight:  [64.1 kg (141 lb 5 oz)] 64.1 kg (141 lb 5 oz) (09/02 0500)  Intake/Output from previous day: 09/01 0701 - 09/02 0700 In: 2743.4 [I.V.:2493.4; IV Piggyback:250] Out: 1550 [Urine:1550] Intake/Output this shift: Total I/O In: 255.7 [P.O.:120; I.V.:135.7] Out: -   Physical Exam: Arouses and becomes agitated.  Requiring sitter.   Lab Results:  Recent Labs  06/08/16 0555 06/09/16 0500  WBC 7.1 6.7  HGB 7.5* 7.9*  HCT 21.6* 23.0*  PLT 84* 111*   BMET  Recent Labs  06/08/16 0555 06/09/16 0500  NA 140 139  K 3.6 3.6  CL 105 106  CO2 27 25  GLUCOSE 96 104*  BUN 10 7  CREATININE 0.84 0.77  CALCIUM 8.4* 8.6*    Studies/Results: No results found.  Assessment/Plan: Continue support and sitter for intermittent agitation. Will need Rehab for TBI.    LOS: 6 days    Coben Godshall D, MD 06/09/2016, 9:48 AM

## 2016-06-10 ENCOUNTER — Encounter (HOSPITAL_COMMUNITY): Payer: Self-pay | Admitting: *Deleted

## 2016-06-10 ENCOUNTER — Inpatient Hospital Stay (HOSPITAL_COMMUNITY): Payer: Medicaid Other

## 2016-06-10 LAB — BASIC METABOLIC PANEL
ANION GAP: 5 (ref 5–15)
CHLORIDE: 106 mmol/L (ref 101–111)
CO2: 26 mmol/L (ref 22–32)
Calcium: 8.6 mg/dL — ABNORMAL LOW (ref 8.9–10.3)
Creatinine, Ser: 0.72 mg/dL (ref 0.61–1.24)
GFR calc Af Amer: >60 mL/min (ref >60)
GLUCOSE: 103 mg/dL — AB (ref 65–99)
POTASSIUM: 3.4 mmol/L — AB (ref 3.5–5.1)
SODIUM: 137 mmol/L (ref 135–145)

## 2016-06-10 LAB — GLUCOSE, CAPILLARY
GLUCOSE-CAPILLARY: 112 mg/dL — AB (ref 65–99)
GLUCOSE-CAPILLARY: 120 mg/dL — AB (ref 65–99)
GLUCOSE-CAPILLARY: 121 mg/dL — AB (ref 65–99)
GLUCOSE-CAPILLARY: 128 mg/dL — AB (ref 65–99)
Glucose-Capillary: 102 mg/dL — ABNORMAL HIGH (ref 65–99)
Glucose-Capillary: 126 mg/dL — ABNORMAL HIGH (ref 65–99)

## 2016-06-10 LAB — MRSA PCR SCREENING: MRSA by PCR: NEGATIVE

## 2016-06-10 LAB — TROPONIN I

## 2016-06-10 MED ORDER — HALOPERIDOL LACTATE 5 MG/ML IJ SOLN
5.0000 mg | Freq: Once | INTRAMUSCULAR | Status: AC
  Administered 2016-06-10: 5 mg via INTRAVENOUS

## 2016-06-10 MED ORDER — FAMOTIDINE 20 MG PO TABS
20.0000 mg | ORAL_TABLET | Freq: Two times a day (BID) | ORAL | Status: DC
  Administered 2016-06-10 – 2016-06-13 (×5): 20 mg via ORAL
  Filled 2016-06-10 (×6): qty 1

## 2016-06-10 NOTE — Progress Notes (Signed)
No acute events Awake, confused Moving all extremities well Stable Ok for lovenox Ok to move out of ICU No active neurosurgical issues Feel free to call with questions
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
## 2016-06-11 LAB — GLUCOSE, CAPILLARY
GLUCOSE-CAPILLARY: 102 mg/dL — AB (ref 65–99)
GLUCOSE-CAPILLARY: 105 mg/dL — AB (ref 65–99)
GLUCOSE-CAPILLARY: 85 mg/dL (ref 65–99)
GLUCOSE-CAPILLARY: 105 mg/dL — AB (ref 65–99)
Glucose-Capillary: 114 mg/dL — ABNORMAL HIGH (ref 65–99)
Glucose-Capillary: 155 mg/dL — ABNORMAL HIGH (ref 65–99)

## 2016-06-11 NOTE — Progress Notes (Signed)
Patient heard yelling out.  When asked what he needed, he stated he wanted to get out of here.  Explanations for care provided.  Patient then began to bang his right arm that is in the splint, on the side rail with significant force.  Patient prevented from further banging of arm on the side rail by relocating the wrist restraint tie.  When asked why he was doing this, patient stated, "I'm trying to break it so I can get out of here".  Patient medicated for pain and comfort.
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
## 2016-06-11 NOTE — Progress Notes (Signed)
Orthopedic Tech Progress Note Patient Details:  Jimmey RalphDenzel L XXXShaw 1995/10/18 [id number]  Ortho Devices Type of Ortho Device: CAM walker Ortho Device/Splint Location: rue Ortho Device/Splint Interventions: Application   Saul FordyceJennifer C Jacklin Zwick 06/11/2016, 6:31 PM

## 2016-06-11 NOTE — Progress Notes (Signed)
Dressing to right arm removed by OT team that was placing the splint.  Two distinct areas that are open were present under prior dressing.  Both are 100% clean, pink tissue.  Areas cleansed with normal saline, and xeroform gauze and ABD pad were placed and secured with spiral gauze. Patient tolerated very well.
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
## 2016-06-11 NOTE — Progress Notes (Signed)
Pt continues to be combative, pulled restraint loose, pulled aspen collar off after multiple explanations of why he needs. Pt continues to pass threats to my self and other staff. A sitter has now become available pt is cussing at sitter. Will continue to monitor.

## 2016-06-12 DIAGNOSIS — S069X3S Unspecified intracranial injury with loss of consciousness of 1 hour to 5 hours 59 minutes, sequela: Secondary | ICD-10-CM

## 2016-06-12 LAB — GLUCOSE, CAPILLARY
GLUCOSE-CAPILLARY: 102 mg/dL — AB (ref 65–99)
GLUCOSE-CAPILLARY: 129 mg/dL — AB (ref 65–99)
Glucose-Capillary: 90 mg/dL (ref 65–99)

## 2016-06-12 MED ORDER — POLYETHYLENE GLYCOL 3350 17 G PO PACK
17.0000 g | PACK | Freq: Every day | ORAL | Status: DC
  Administered 2016-06-12 – 2016-06-13 (×2): 17 g via ORAL
  Filled 2016-06-12 (×2): qty 1

## 2016-06-12 MED ORDER — QUETIAPINE FUMARATE 25 MG PO TABS: 25.0000 mg | ORAL_TABLET | Freq: Every day | ORAL | Status: DC

## 2016-06-12 MED ORDER — TRAMADOL HCL 50 MG PO TABS: 50.0000 mg | ORAL_TABLET | Freq: Four times a day (QID) | ORAL | Status: DC | PRN

## 2016-06-12 MED ORDER — AMANTADINE HCL 50 MG/5ML PO SYRP
100.0000 mg | ORAL_SOLUTION | Freq: Two times a day (BID) | ORAL | Status: DC
  Administered 2016-06-12 – 2016-06-13 (×3): 100 mg via ORAL
  Filled 2016-06-12 (×5): qty 10

## 2016-06-12 MED ORDER — MORPHINE SULFATE (PF) 2 MG/ML IV SOLN
2.0000 mg | INTRAVENOUS | Status: DC | PRN
  Administered 2016-06-12 (×2): 2 mg via INTRAVENOUS
  Filled 2016-06-12 (×2): qty 1
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
## 2016-06-12 NOTE — Progress Notes (Signed)
RN transferred patient to [phone] via bed with patient belongings.
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
## 2016-06-12 NOTE — Progress Notes (Signed)
Patient ID: Travis Mcguire, male   DOB: 1995-10-14, 20 y.o.   MRN: [id number] Subjective:  The patient is alert and pleasant. He is less agitated. He'll will answer simple questions.  Objective: Vital signs in last 24 hours: Temp:  [97.5 F (36.4 C)-98.5 F (36.9 C)] 98 F (36.7 C) (09/05 0713) Pulse Rate:  [59-103] 59 (09/05 0713) Resp:  [12-21] 17 (09/05 0713) BP: (134-151)/(66-88) 134/81 (09/05 0713) SpO2:  [98 %-100 %] 100 % (09/05 0713) Weight:  [61.3 kg (135 lb 2.3 oz)] 61.3 kg (135 lb 2.3 oz) (09/05 0400)  Intake/Output from previous day: 09/04 0701 - 09/05 0700 In: 2580 [P.O.:120; I.V.:2210; IV Piggyback:250] Out: 2525 [Urine:2525] Intake/Output this shift: No intake/output data recorded.  Physical exam the patient is alert and oriented 2, Glasgow Coma Scale 14. He is oriented to person and a hospital. He is moving all 4 extremities well.  His CAT scan performed 06/10/2016 demonstrates cerebral contusions without significant mass effect.  Lab Results: No results for input(s): WBC, HGB, HCT, PLT in the last 72 hours. BMET  Recent Labs  06/10/16 1345  NA 137  K 3.4*  CL 106  CO2 26  GLUCOSE 103*  BUN <5*  CREATININE 0.72  CALCIUM 8.6*    Studies/Results: No results found.  Assessment/Plan: Traumatic brain injury, cerebral contusions: The patient is stable clinically. It looks like on the rehabilitation. I'll sign off. Please call if I can help.  LOS: 9 days     Hosie Sharman D 06/12/2016, 7:45 AM
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
## 2016-06-12 NOTE — Progress Notes (Signed)
Pt tolerating dysphagia diet with strict observation.  CIR consult in progress.  Continue therapies withTBI team.    Quintella BatonJulie W. Kaelin Bonelli, RN, BSN  Trauma/Neuro ICU Case Manager [phone]

## 2016-06-13 ENCOUNTER — Inpatient Hospital Stay (HOSPITAL_COMMUNITY)
Admission: RE | Admit: 2016-06-13 | Discharge: 2016-07-14 | DRG: 949 | Disposition: A | Discharge status: Home Health | Payer: Medicaid Other | Source: Intra-hospital | Attending: Physical Medicine & Rehabilitation | Admitting: Physical Medicine & Rehabilitation

## 2016-06-13 ENCOUNTER — Encounter (HOSPITAL_COMMUNITY): Payer: Self-pay | Admitting: Emergency Medicine

## 2016-06-13 ENCOUNTER — Encounter (HOSPITAL_COMMUNITY): Payer: Self-pay | Admitting: Nurse Practitioner

## 2016-06-13 ENCOUNTER — Inpatient Hospital Stay (HOSPITAL_COMMUNITY)
Admission: RE | Admit: 2016-06-13 | Payer: Self-pay | Source: Intra-hospital | Admitting: Physical Medicine & Rehabilitation

## 2016-06-13 DIAGNOSIS — D696 Thrombocytopenia, unspecified: Secondary | ICD-10-CM | POA: Diagnosis not present

## 2016-06-13 DIAGNOSIS — S12600A Unspecified displaced fracture of seventh cervical vertebra, initial encounter for closed fracture: Secondary | ICD-10-CM | POA: Diagnosis present

## 2016-06-13 DIAGNOSIS — S06353S Traumatic hemorrhage of left cerebrum with loss of consciousness of 1 hours to 5 hours 59 minutes, sequela: Secondary | ICD-10-CM

## 2016-06-13 DIAGNOSIS — D62 Acute posthemorrhagic anemia: Secondary | ICD-10-CM

## 2016-06-13 DIAGNOSIS — S06309A Unspecified focal traumatic brain injury with loss of consciousness of unspecified duration, initial encounter: Secondary | ICD-10-CM

## 2016-06-13 DIAGNOSIS — R45851 Suicidal ideations: Secondary | ICD-10-CM | POA: Diagnosis not present

## 2016-06-13 DIAGNOSIS — F419 Anxiety disorder, unspecified: Secondary | ICD-10-CM | POA: Diagnosis present

## 2016-06-13 DIAGNOSIS — S82402S Unspecified fracture of shaft of left fibula, sequela: Secondary | ICD-10-CM

## 2016-06-13 DIAGNOSIS — R451 Restlessness and agitation: Secondary | ICD-10-CM | POA: Diagnosis present

## 2016-06-13 DIAGNOSIS — S82402D Unspecified fracture of shaft of left fibula, subsequent encounter for closed fracture with routine healing: Secondary | ICD-10-CM

## 2016-06-13 DIAGNOSIS — F1910 Other psychoactive substance abuse, uncomplicated: Secondary | ICD-10-CM

## 2016-06-13 DIAGNOSIS — S0292XS Unspecified fracture of facial bones, sequela: Secondary | ICD-10-CM

## 2016-06-13 DIAGNOSIS — S0219XD Other fracture of base of skull, subsequent encounter for fracture with routine healing: Secondary | ICD-10-CM

## 2016-06-13 DIAGNOSIS — S82402A Unspecified fracture of shaft of left fibula, initial encounter for closed fracture: Secondary | ICD-10-CM | POA: Diagnosis present

## 2016-06-13 DIAGNOSIS — R000 Tachycardia, unspecified: Secondary | ICD-10-CM | POA: Diagnosis present

## 2016-06-13 DIAGNOSIS — D72829 Elevated white blood cell count, unspecified: Secondary | ICD-10-CM | POA: Diagnosis present

## 2016-06-13 DIAGNOSIS — S0101XA Laceration without foreign body of scalp, initial encounter: Secondary | ICD-10-CM | POA: Diagnosis present

## 2016-06-13 DIAGNOSIS — S069X3S Unspecified intracranial injury with loss of consciousness of 1 hour to 5 hours 59 minutes, sequela: Secondary | ICD-10-CM

## 2016-06-13 DIAGNOSIS — S52021A Displaced fracture of olecranon process without intraarticular extension of right ulna, initial encounter for closed fracture: Secondary | ICD-10-CM | POA: Diagnosis present

## 2016-06-13 DIAGNOSIS — I619 Nontraumatic intracerebral hemorrhage, unspecified: Secondary | ICD-10-CM

## 2016-06-13 DIAGNOSIS — S8290XS Unspecified fracture of unspecified lower leg, sequela: Secondary | ICD-10-CM

## 2016-06-13 DIAGNOSIS — Z9189 Other specified personal risk factors, not elsewhere classified: Secondary | ICD-10-CM

## 2016-06-13 DIAGNOSIS — R4689 Other symptoms and signs involving appearance and behavior: Secondary | ICD-10-CM

## 2016-06-13 DIAGNOSIS — R4586 Emotional lability: Secondary | ICD-10-CM | POA: Diagnosis present

## 2016-06-13 DIAGNOSIS — S069X3A Unspecified intracranial injury with loss of consciousness of 1 hour to 5 hours 59 minutes, initial encounter: Secondary | ICD-10-CM | POA: Diagnosis present

## 2016-06-13 DIAGNOSIS — K5900 Constipation, unspecified: Secondary | ICD-10-CM | POA: Diagnosis present

## 2016-06-13 DIAGNOSIS — R4587 Impulsiveness: Secondary | ICD-10-CM | POA: Diagnosis present

## 2016-06-13 DIAGNOSIS — S069X0S Unspecified intracranial injury without loss of consciousness, sequela: Secondary | ICD-10-CM

## 2016-06-13 DIAGNOSIS — S52021S Displaced fracture of olecranon process without intraarticular extension of right ulna, sequela: Secondary | ICD-10-CM

## 2016-06-13 DIAGNOSIS — S52023A Displaced fracture of olecranon process without intraarticular extension of unspecified ulna, initial encounter for closed fracture: Secondary | ICD-10-CM

## 2016-06-13 DIAGNOSIS — R1310 Dysphagia, unspecified: Secondary | ICD-10-CM | POA: Diagnosis present

## 2016-06-13 DIAGNOSIS — F17210 Nicotine dependence, cigarettes, uncomplicated: Secondary | ICD-10-CM | POA: Diagnosis present

## 2016-06-13 DIAGNOSIS — S52021D Displaced fracture of olecranon process without intraarticular extension of right ulna, subsequent encounter for closed fracture with routine healing: Secondary | ICD-10-CM | POA: Diagnosis not present

## 2016-06-13 DIAGNOSIS — S065X9D Traumatic subdural hemorrhage with loss of consciousness of unspecified duration, subsequent encounter: Secondary | ICD-10-CM | POA: Diagnosis present

## 2016-06-13 DIAGNOSIS — S0101XS Laceration without foreign body of scalp, sequela: Secondary | ICD-10-CM

## 2016-06-13 DIAGNOSIS — S0291XS Unspecified fracture of skull, sequela: Secondary | ICD-10-CM

## 2016-06-13 DIAGNOSIS — S51811S Laceration without foreign body of right forearm, sequela: Secondary | ICD-10-CM

## 2016-06-13 DIAGNOSIS — S51811A Laceration without foreign body of right forearm, initial encounter: Secondary | ICD-10-CM | POA: Diagnosis present

## 2016-06-13 DIAGNOSIS — S42309S Unspecified fracture of shaft of humerus, unspecified arm, sequela: Secondary | ICD-10-CM

## 2016-06-13 DIAGNOSIS — S12601S Unspecified nondisplaced fracture of seventh cervical vertebra, sequela: Secondary | ICD-10-CM

## 2016-06-13 DIAGNOSIS — S0292XA Unspecified fracture of facial bones, initial encounter for closed fracture: Secondary | ICD-10-CM | POA: Diagnosis present

## 2016-06-13 DIAGNOSIS — S069X9S Unspecified intracranial injury with loss of consciousness of unspecified duration, sequela: Secondary | ICD-10-CM

## 2016-06-13 LAB — CBC
HCT: 31.6 % — ABNORMAL LOW (ref 39.0–52.0)
Hemoglobin: 10.6 g/dL — ABNORMAL LOW (ref 13.0–17.0)
MCH: 32.7 pg (ref 26.0–34.0)
MCHC: 33.5 g/dL (ref 30.0–36.0)
MCV: 97.5 fL (ref 78.0–100.0)
PLATELETS: 244 10*3/uL (ref 150–400)
RBC: 3.24 MIL/uL — AB (ref 4.22–5.81)
RDW: 14 % (ref 11.5–15.5)
WBC: 11.8 10*3/uL — ABNORMAL HIGH (ref 4.0–10.5)

## 2016-06-13 LAB — CREATININE, SERUM
Creatinine, Ser: 1.04 mg/dL (ref 0.61–1.24)
GFR calc Af Amer: >60 mL/min (ref >60)
GFR calc non Af Amer: >60 mL/min (ref >60)

## 2016-06-13 MED ORDER — LORAZEPAM 2 MG/ML IJ SOLN
1.0000 mg | Freq: Four times a day (QID) | INTRAMUSCULAR | Status: DC | PRN
  Administered 2016-06-17 – 2016-07-02 (×5): 1 mg via INTRAMUSCULAR
  Filled 2016-06-13 (×8): qty 1

## 2016-06-13 MED ORDER — ACETAMINOPHEN 325 MG PO TABS
325.0000 mg | ORAL_TABLET | ORAL | Status: DC | PRN
Start: 2016-06-13 — End: 2016-07-14
  Administered 2016-06-20 – 2016-06-28 (×4): 650 mg via ORAL
  Administered 2016-07-05: 325 mg via ORAL
  Administered 2016-07-13: 650 mg via ORAL
  Filled 2016-06-13 (×4): qty 2
  Filled 2016-06-13: qty 1
  Filled 2016-06-13 (×2): qty 2

## 2016-06-13 MED ORDER — QUETIAPINE FUMARATE 25 MG PO TABS
25.0000 mg | ORAL_TABLET | Freq: Every day | ORAL | Status: DC
  Administered 2016-06-13 – 2016-06-17 (×5): 25 mg
  Filled 2016-06-13 (×6): qty 1

## 2016-06-13 MED ORDER — SORBITOL 70 % SOLN
30.0000 mL | Freq: Every day | Status: DC | PRN
Start: 2016-06-13 — End: 2016-07-14

## 2016-06-13 MED ORDER — ENSURE ENLIVE PO LIQD
237.0000 mL | Freq: Two times a day (BID) | ORAL | Status: DC
  Administered 2016-06-15 – 2016-07-14 (×14): 237 mL via ORAL

## 2016-06-13 MED ORDER — TRAMADOL HCL 50 MG PO TABS
50.0000 mg | ORAL_TABLET | Freq: Four times a day (QID) | ORAL | Status: DC | PRN
  Administered 2016-06-13 – 2016-06-18 (×3): 100 mg via ORAL
  Administered 2016-06-18: 50 mg via ORAL
  Administered 2016-06-19: 100 mg via ORAL
  Administered 2016-06-20: 50 mg via ORAL
  Administered 2016-06-20 – 2016-06-22 (×5): 100 mg via ORAL
  Administered 2016-06-23: 50 mg via ORAL
  Administered 2016-06-23: 100 mg via ORAL
  Administered 2016-06-23 – 2016-06-24 (×2): 50 mg via ORAL
  Administered 2016-06-25 – 2016-07-07 (×11): 100 mg via ORAL
  Administered 2016-07-13: 50 mg via ORAL
  Filled 2016-06-13: qty 1
  Filled 2016-06-13 (×5): qty 2
  Filled 2016-06-13 (×2): qty 1
  Filled 2016-06-13 (×8): qty 2
  Filled 2016-06-13: qty 1
  Filled 2016-06-13 (×3): qty 2
  Filled 2016-06-13: qty 1
  Filled 2016-06-13 (×5): qty 2
  Filled 2016-06-13 (×3): qty 1
  Filled 2016-06-13 (×2): qty 2
  Filled 2016-06-13: qty 1
  Filled 2016-06-13: qty 2
  Filled 2016-06-13 (×2): qty 1
  Filled 2016-06-13: qty 2

## 2016-06-13 MED ORDER — PNEUMOCOCCAL VAC POLYVALENT 25 MCG/0.5ML IJ INJ
0.5000 mL | INJECTION | INTRAMUSCULAR | Status: DC
  Filled 2016-06-13: qty 0.5

## 2016-06-13 MED ORDER — SODIUM CHLORIDE 0.9% FLUSH
10.0000 mL | INTRAVENOUS | Status: DC | PRN
  Administered 2016-06-13 – 2016-06-14 (×2): 10 mL
  Filled 2016-06-13 (×2): qty 40

## 2016-06-13 MED ORDER — ENOXAPARIN SODIUM 40 MG/0.4ML ~~LOC~~ SOLN: 40.0000 mg | SUBCUTANEOUS | Status: DC

## 2016-06-13 MED ORDER — LORAZEPAM 1 MG PO TABS
1.0000 mg | ORAL_TABLET | Freq: Four times a day (QID) | ORAL | Status: DC | PRN
  Administered 2016-06-13 – 2016-07-12 (×18): 1 mg via ORAL
  Filled 2016-06-13 (×20): qty 1

## 2016-06-13 MED ORDER — ENOXAPARIN SODIUM 40 MG/0.4ML ~~LOC~~ SOLN
40.0000 mg | SUBCUTANEOUS | Status: DC
  Administered 2016-06-15: 40 mg via SUBCUTANEOUS
  Filled 2016-06-13 (×2): qty 0.4

## 2016-06-13 MED ORDER — ONDANSETRON HCL 4 MG/2ML IJ SOLN: 4.0000 mg | Freq: Four times a day (QID) | INTRAMUSCULAR | Status: DC | PRN

## 2016-06-13 MED ORDER — FAMOTIDINE 20 MG PO TABS
20.0000 mg | ORAL_TABLET | Freq: Two times a day (BID) | ORAL | Status: DC
  Administered 2016-06-14 – 2016-07-03 (×34): 20 mg via ORAL
  Filled 2016-06-13 (×2): qty 1
  Filled 2016-06-13: qty 2
  Filled 2016-06-13 (×29): qty 1
  Filled 2016-06-13: qty 2
  Filled 2016-06-13 (×7): qty 1

## 2016-06-13 MED ORDER — POLYETHYLENE GLYCOL 3350 17 G PO PACK
17.0000 g | PACK | Freq: Every day | ORAL | Status: DC
  Administered 2016-06-14 – 2016-06-16 (×3): 17 g via ORAL
  Filled 2016-06-13 (×3): qty 1

## 2016-06-13 MED ORDER — AMANTADINE HCL 50 MG/5ML PO SYRP
100.0000 mg | ORAL_SOLUTION | Freq: Two times a day (BID) | ORAL | Status: DC
  Administered 2016-06-14 – 2016-06-18 (×9): 100 mg via ORAL
  Filled 2016-06-13 (×10): qty 10

## 2016-06-13 MED ORDER — ONDANSETRON HCL 4 MG PO TABS: 4.0000 mg | ORAL_TABLET | Freq: Four times a day (QID) | ORAL | Status: DC | PRN
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
## 2016-06-13 NOTE — Progress Notes (Signed)
Report given to 4W RN.
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
## 2016-06-13 NOTE — Interval H&P Note (Signed)
Travis Mcguire was admitted today to Inpatient Rehabilitation with the diagnosis of TBI/SDH, C7 lamina and facet fracture-cervical collar, sphenoid fracture, left inferior orbital rim and anterior maxillary wall fracture, right olecranon fracture, and left fibula fracture.  The patient's history has been reviewed, patient examined, and there is no change in status.  Patient continues to be appropriate for intensive inpatient rehabilitation.  I have reviewed the patient's chart and labs.  Questions were answered to the patient's satisfaction. The PAPE has been reviewed and assessment remains appropriate.  Ankit Karis Jubanil Patel 06/13/2016, 10:05 PM

## 2016-06-13 NOTE — Progress Notes (Signed)
Patient ID: Travis Mcguire, male   DOB: 08-Dec-1995, 20 y.o.   MRN: [id number]   LOS: 10 days   Subjective: Impulsive, short term memory still impaired but getting better. Doesn't want to go to CIR but willing to if it means he'll get better quicker.   Objective: Vital signs in last 24 hours: Temp:  [98.3 F (36.8 C)-98.9 F (37.2 C)] 98.4 F (36.9 C) (09/06 0548) Pulse Rate:  [37-132] 106 (09/06 0548) Resp:  [17-24] 20 (09/06 0548) BP: (127-161)/(63-93) 128/63 (09/06 0548) SpO2:  [75 %-100 %] 100 % (09/06 0548) Weight:  [60.3 kg (132 lb 15 oz)-62.7 kg (138 lb 3.7 oz)] 60.3 kg (132 lb 15 oz) (09/06 0548) Last BM Date: 06/10/16   Physical Exam General appearance: alert and no distress Resp: clear to auscultation bilaterally Cardio: regular rate and rhythm GI: normal findings: bowel sounds normal and soft, non-tender   Assessment/Plan: PHBC TBI/L frontal ICC/SDH- TBI team therapies, amantadine C7 lamina and facet FX- collar per Dr. Lovell SheehanJenkins L scalp lac ABL anemia - stabilized Thrombocytopenia- Resolved L orbit/max sinus/sphenoid FXs- per Dr. Suszanne Connerseoh, non-op R olecranon FX - splint per Dr. Merlyn LotKuzma R forearm soft tissue injury- Xeroform per Dr. Merlyn LotKuzma L fibula FX- per Dr. Linna CapriceSwinteck FEN- speech approved for dysphagia diet with STRICT observation. Tolerating this.  VTE- SCD's, Lovenox Dispo- CIR when bed available    Freeman CaldronMichael J. Gunda Maqueda, PA-C Pager: [phone] General Trauma PA Pager: [phone]  06/13/2016

## 2016-06-13 NOTE — Progress Notes (Signed)
Patient ID: Travis Mcguire, male   DOB: [birth year], 20 y.o.   MRN: [id number] Patient admitted to [phone] via wheelchair, escorted by nursing staff and mother.  Patient and family verbalized understanding of rehab process.  Patient placed in enclosure bed.  Appears to be in no immediate distress at this time.  Dani Gobbleeardon, Jacie Tristan J, RN
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
## 2016-06-13 NOTE — Progress Notes (Signed)
Inpatient Rehabilitation  Met with patient to discuss team's recommendation for IP Rehab.  Shared booklets and answered questions.  Patient reports being eager to shower.  Patient requested that I notify his girlfriend and he provided me with her phone number.  Discussed plans with mom, Feliberto Stockley and girlfriend, Tonia Brooms who patient lived with prior to admission.  All are in agreement and patient is medically cleared to admit today.  Notified RN CM and CSW.  Please call with questions.    Carmelia Roller., CCC/SLP Admission Coordinator  [place]  Cell [phone]
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
## 2016-06-14 ENCOUNTER — Inpatient Hospital Stay (HOSPITAL_COMMUNITY): Payer: Medicaid Other | Admitting: Physical Therapy

## 2016-06-14 ENCOUNTER — Inpatient Hospital Stay (HOSPITAL_COMMUNITY): Payer: Medicaid Other | Admitting: Speech Pathology

## 2016-06-14 ENCOUNTER — Inpatient Hospital Stay (HOSPITAL_COMMUNITY): Payer: Self-pay | Admitting: Occupational Therapy

## 2016-06-14 ENCOUNTER — Inpatient Hospital Stay (HOSPITAL_COMMUNITY): Payer: Self-pay

## 2016-06-14 ENCOUNTER — Inpatient Hospital Stay (HOSPITAL_COMMUNITY): Payer: Medicaid Other

## 2016-06-14 DIAGNOSIS — M79609 Pain in unspecified limb: Secondary | ICD-10-CM

## 2016-06-14 DIAGNOSIS — S069X3S Unspecified intracranial injury with loss of consciousness of 1 hour to 5 hours 59 minutes, sequela: Secondary | ICD-10-CM

## 2016-06-14 LAB — COMPREHENSIVE METABOLIC PANEL
ALT: 42 U/L (ref 17–63)
AST: 48 U/L — AB (ref 15–41)
Albumin: 3.5 g/dL (ref 3.5–5.0)
Alkaline Phosphatase: 64 U/L (ref 38–126)
Anion gap: 10 (ref 5–15)
BILIRUBIN TOTAL: 1.4 mg/dL — AB (ref 0.3–1.2)
BUN: 13 mg/dL (ref 6–20)
CALCIUM: 9.4 mg/dL (ref 8.9–10.3)
CO2: 26 mmol/L (ref 22–32)
CREATININE: 0.83 mg/dL (ref 0.61–1.24)
Chloride: 100 mmol/L — ABNORMAL LOW (ref 101–111)
GFR calc Af Amer: >60 mL/min (ref >60)
Glucose, Bld: 98 mg/dL (ref 65–99)
Potassium: 3.7 mmol/L (ref 3.5–5.1)
Sodium: 136 mmol/L (ref 135–145)
TOTAL PROTEIN: 7.2 g/dL (ref 6.5–8.1)

## 2016-06-14 LAB — CBC WITH DIFFERENTIAL/PLATELET
Basophils Absolute: 0 10*3/uL (ref 0.0–0.1)
Basophils Relative: 0 %
EOS PCT: 1 %
Eosinophils Absolute: 0.1 10*3/uL (ref 0.0–0.7)
HEMATOCRIT: 30.8 % — AB (ref 39.0–52.0)
Hemoglobin: 10.3 g/dL — ABNORMAL LOW (ref 13.0–17.0)
LYMPHS ABS: 1.9 10*3/uL (ref 0.7–4.0)
Lymphocytes Relative: 16 %
MCH: 32.6 pg (ref 26.0–34.0)
MCHC: 33.4 g/dL (ref 30.0–36.0)
MCV: 97.5 fL (ref 78.0–100.0)
MONOS PCT: 16 %
Monocytes Absolute: 1.9 10*3/uL — ABNORMAL HIGH (ref 0.1–1.0)
Neutro Abs: 8 10*3/uL — ABNORMAL HIGH (ref 1.7–7.7)
Neutrophils Relative %: 67 %
Platelets: 243 10*3/uL (ref 150–400)
RBC: 3.16 MIL/uL — AB (ref 4.22–5.81)
RDW: 14.2 % (ref 11.5–15.5)
WBC: 11.9 10*3/uL — AB (ref 4.0–10.5)
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
## 2016-06-14 NOTE — Progress Notes (Signed)
Occupational Therapy Note  Patient Details  Name: Gustavus MessingDenzel L XXX Milham MRN: [id number] Date of Birth: 12/15/1995  Today's Date: 06/14/2016 OT Individual Time: 1000-1030 OT Individual Time Calculation (min): 30 min    Pt denied pain Individual Therapy Therapy initiated after discussion with evaluating OTR and review of LTG/POC. Pt resting in recliner upon arrival with RN present.  Reviewed goals of therapy while in Rehab.  Pt oriented to place and month, but unable to recall date or situation.  Pt transferred to w/c with min A.  Pt persistently commented during session that he didn't need to be in Rehab more than a couple of days.  Reviewed schedule with patient.  Pt required max verbal cues to recall schedule after 3 mins.  Pt stated he only needed about 14 therapy session and since he was having 5 today, he only needed to be here a couple of more day.  Educated patient on procedure for determining length of stay.  Pt verbalized understanding but continued to state he only needed to be here a couple of days.  Attempted to adjust pt's cervical collar and pt stated that it would only get "so tight" because I don't let it get tight.  Educated pt on importance of cervical collar to stabilize his vertebrae.  Pt stated he was going to do what he was going to do and he would remove it after he was in bed.  Encouraged pt to keep collar on at all time.  Pt returned to room and enclosure bed.  Pt remained in secured enclosure bed with all needs within reach.   [name], Melvin [facility]ford [facility] 06/14/2016, 10:42 AM
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
## 2016-06-14 NOTE — Progress Notes (Signed)
Pt got out of enclosure bed and was walking around. Pt asking how many people are each way in the hallway so he can sneak out by them.  RN's were able to get him in a WC with quick release. Pt was rolling around the unit stepping on RN feet saying he had a boot on and I didn't therefore I would be the one to get hurt if I didn't let him leave. Portable says no enclosure bed was available. Cheri GuppyJennifer Bailey notified and said it was okay to have 1 on 1 until a bed was delivered/ pt asleep and calm.  Another enclosure bed was delivered around 9pm. Pt was medicated with scheduled medications and PRN ativan and placed back in enclosure bed after removing his aspen collar and CAM walker boot. Currently sleeping. Will continue to monitor Travis Mcguire, Travis Derk E, RN
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
## 2016-06-14 NOTE — Progress Notes (Signed)
Patient information reviewed and entered into eRehab system by Ranell Skibinski, RN, CRRN, PPS Coordinator.  Information including medical coding and functional independence measure will be reviewed and updated through discharge.    
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
## 2016-06-14 NOTE — Progress Notes (Signed)
*  PRELIMINARY RESULTS* Vascular Ultrasound Lower extremity venous duplex has been completed.  Preliminary findings: No evidence of DVT or baker's cyst.  Farrel DemarkJill Eunice, RDMS, RVT  06/14/2016, 5:38 PM
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
## 2016-06-15 ENCOUNTER — Inpatient Hospital Stay (HOSPITAL_COMMUNITY): Payer: Medicaid Other | Admitting: Speech Pathology

## 2016-06-15 ENCOUNTER — Inpatient Hospital Stay (HOSPITAL_COMMUNITY): Payer: Medicaid Other | Admitting: Physical Therapy

## 2016-06-15 ENCOUNTER — Inpatient Hospital Stay (HOSPITAL_COMMUNITY): Payer: Self-pay

## 2016-06-15 MED ORDER — PROPRANOLOL HCL 10 MG PO TABS
10.0000 mg | ORAL_TABLET | Freq: Three times a day (TID) | ORAL | Status: DC
  Filled 2016-06-15 (×4): qty 1

## 2016-06-15 NOTE — Progress Notes (Signed)
Occupational Therapy Note  Patient Details  Name: Travis Mcguire MRN: [id number] Date of Birth: 02/28/1996  Today's Date: 06/15/2016 OT Missed Time: 60 Minutes Missed Time Reason: Patient unwilling/refused to participate without medical reason;Patient fatigue   Pt missed 60 mins skilled OT services.  Pt asleep in enclosure bed upon arrival.  Pt easily aroused but quickly stated that his "brain hurt" and he couldn't do anything.  Reminded pt that he had earlier agreed to taking a shower after lunch.  Pt remembered conversation but continued to state that he wasn't going to do anything because he was too tired.  Pt remained in secured enclosure bed with all needs within reach.    [name], [name] [name] 06/15/2016, 1:39 PM

## 2016-06-15 NOTE — Progress Notes (Signed)
Speech Language Pathology Daily Session Note  Patient Details  Name: Travis MessingDenzel L XXX Escalona MRN: [id number] Date of Birth: 1995-12-13  Today's Date: 06/15/2016 SLP Individual Time: 0930-1030 SLP Individual Time Calculation (min): 60 min   Short Term Goals: Week 1: SLP Short Term Goal 1 (Week 1): Patient will consume current diet with Min A verbal cues for use of swallowing compensatory strategies. SLP Short Term Goal 2 (Week 1): Patient will demonstrate efficient mastication with trials of Dys. 3 textures without overt s/s of aspiration with Min A verbal cues for use of swallowing compensatory strategies over 2 consecutive sessions.  SLP Short Term Goal 3 (Week 1): Patient will demonstrate functional problem solving for basic and functional tasks with Mod A verbal cues. SLP Short Term Goal 4 (Week 1): Patient will self-monitor and correct errors during functional tasks with Mod A verbal and visual cues.  SLP Short Term Goal 5 (Week 1): Patient will demonstrate sustained attention to functional tasks for 30 minutes with Min A verbal cues for redirection.  SLP Short Term Goal 6 (Week 1): Patient will identify 2 physical and 2 cognitive impairments with Mod A verbal and question cues.   Skilled Therapeutic Interventions: Skilled treatment session focused on dysphagia and cognitive goals. SLP facilitated session by providing Min A verbal cues for use of small bites/sips with breakfast meal of Dys. 2 textures with thin liquids. Patient did not demonstrate any overt s/s of aspiration despite consuming large, sequential sips of thin liquids via straw. Patient required Max A verbal cues to keep c-collar in place throughout session and Max-Total A for recall of events from previous therapy sessions. Patient participated in a basic money management task and required Max A verbal cues for problem solving and recall throughout task which was impacted by impulsivity. Patient's basic reading comprehension was also  assessed and appeared [facility]. Patient left supine in secured enclosure bed with all needs within reach. Continue with current plan of care.   Function:  Eating Eating   Modified Consistency Diet: Yes Eating Assist Level: Supervision or verbal cues           Cognition Comprehension Comprehension assist level: Understands basic 50 - 74% of the time/ requires cueing 25 - 49% of the time  Expression   Expression assist level: Expresses basic 90% of the time/requires cueing < 10% of the time.  Social Interaction Social Interaction assist level: Interacts appropriately 50 - 74% of the time - May be physically or verbally inappropriate.  Problem Solving Problem solving assist level: Solves basic 25 - 49% of the time - needs direction more than half the time to initiate, plan or complete simple activities  Memory Memory assist level: Recognizes or recalls 25 - 49% of the time/requires cueing 50 - 75% of the time    Pain Pain Assessment Pain Assessment: No/denies pain Pain Score: 0-No pain  Therapy/Group: Individual Therapy  Airel Magadan 06/15/2016, 11:38 AM

## 2016-06-15 NOTE — Progress Notes (Signed)
Physical Therapy Session Note  Patient Details  Name: Travis Mcguire MRN: [id number] Date of Birth: 07-02-1996  Today's Date: 06/15/2016 PT Individual Time: 1520-1630 PT Individual Time Calculation (min): 70 min    Short Term Goals: Week 1:  PT Short Term Goal 1 (Week 1): Patient will maintain NWB RUE with mod multimodal cues with functional mobility tasks.  PT Short Term Goal 2 (Week 1): Patient will ambulate 150 ft with close supervision.  PT Short Term Goal 3 (Week 1): Patient will perform transfers with consistent supervision.  PT Short Term Goal 4 (Week 1): Patient will sustain attention to functional tasks x 15 min with mod multimodal cues.  PT Short Term Goal 5 (Week 1): Patient will identify 2 physical and 2 cognitive deficits with mod A.   Skilled Therapeutic Interventions/Progress Updates:    Session focused on sustained attention, safety, intellectual awareness, and initiation of family training for TBI education with patient's mother. Patient in room upon arrival with NT departing, patient agreeable to donning shoes and CAM boot. Patient donned tennis shoe on LLE, shouting in pain, and did not recall need for CAM boot LLE. Patient then attempted to don wrong shoe on R foot and required max cues for putting R shoe on R foot. Patient's mother arrived, which escalated patient's perseveration on leaving rehab today with mom and asking to use cell phone and increased verbal agitation. Patient required max multimodal cues and more than reasonable time for redirection and participation in therapy. Eventually patient agreeable to propel wheelchair to gym. Patient continues to require total cues to maintain NWB RUE and when therapist touched patient's R cast to place in lap as he was not complying with verbal cues, he lunged at therapist. Patient alternated between propelling wheelchair and impulsively standing from or sitting in unlocked wheelchair requiring PT to stabilize chair to ambulate  with close supervision. In gym, patient unable to engage in therapeutic activity due to verbosity and increased agitation with not being able to leave rehab. Education reinforced on team meeting Tuesday to determine DC date. Patient required total A for intellectual awareness of cognitive and physical deficits this date. Patient engaged with multiple staff members unknown to patient in attempt to get them to agree to his DC today but was able to be redirected successfully with increased time. Initiated TBI education with patient's mother as she would yell back at him, further escalating situation.   CSW arrived to gym and removed mother to discuss DC planning/paperwork. After patient's mother left, he was able to attend to basic sorting tasks x 15 min with min-mod verbal cues for redirection and mod-max questioning cues to successfully complete task. Patient then upset and perseverating on locating mother, stating, "I'm scared that she left me." PT and patient joined CSW and mother in conference room with focus on goals and purpose of rehab and patient impairments as well as team conference planned Tuesday to determine DC date. Patient would verbalize agreement and then give contradictory statement which repeated multiple times.  When discussing need for cervical collar, patient ripped off collar twice despite cues to keep it on and began moving neck in full ROM to "show what's wrong" despite CSW, mom, and PT asking patient to stop and redon cervical collar. When PT attempted to stop patient from moving neck to prevent injury, he became agitated, almost swung at PT, and stated, "You know I don't swing at women, but..." Eventually patient agreeable to lie down while mother finished paperwork  with CSW. Patient left secure in enclosure bed with call bell.     Therapy Documentation Precautions:  Precautions Precautions: Fall, Cervical Required Braces or Orthoses: Cervical Brace, Other Brace/Splint Cervical  Brace: Hard collar Other Brace/Splint: L CAM boot for comfort, RUE splint Restrictions Weight Bearing Restrictions: Yes RUE Weight Bearing: Non weight bearing LLE Weight Bearing: Weight bearing as tolerated Other Position/Activity Restrictions: in CAM boot Pain: Pain Assessment Pain Assessment: Faces Faces Pain Scale: Hurts a little bit Pain Type: Acute pain Pain Location: Leg Pain Orientation: Left Pain Descriptors / Indicators: Grimacing Pain Onset: With Activity Pain Intervention(s): Rest;Repositioned   See Function Navigator for Current Functional Status.   Therapy/Group: Individual Therapy  Kerney Elbe 06/15/2016, 4:52 PM

## 2016-06-15 NOTE — IPOC Note (Signed)
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)   C7 cervical fracture (HCC)   Acute blood loss anemia     Functional Problem List: Nursing Behavior, Endurance, Medication Management, Motor, Skin Integrity, Safety, Perception, Nutrition  PT Balance, Behavior, Edema, Endurance, Motor, Pain, Safety  OT Balance, Behavior, Cognition, Edema, Endurance, Motor, Nutrition, Pain, Perception, Safety, Sensory, Skin Integrity  SLP Cognition  TR         Basic ADL's: OT Grooming, Bathing, Dressing, Toileting     Advanced  ADL's: OT       Transfers: PT Bed Mobility, Bed to Chair, Car, Occupational psychologisturniture  OT Toilet, Research scientist (life sciences)Tub/Shower     Locomotion: PT Ambulation, Psychologist, prison and probation servicesWheelchair Mobility, Stairs     Additional Impairments: OT Fuctional Use of Upper Extremity  SLP Swallowing, Social Cognition   Social Interaction, Problem Solving, Memory, Attention, Awareness  TR      Anticipated Outcomes Item Anticipated Outcome  Self Feeding n/a  Swallowing  Supervision with least restrictive diet   Basic self-care  supervision  Toileting  supervision   Bathroom Transfers supervision  Bowel/Bladder  Mod I assist  Transfers  mod I  Locomotion  mod I household, supervision community  Communication     Cognition  Min A  Pain  < 4  Safety/Judgment  min assist   Therapy Plan: PT Intensity: Minimum of 1-2 x/day ,45 to 90 minutes PT Frequency: 5 out of 7 days PT Duration Estimated Length of Stay: 12-14 days OT Intensity: Minimum of 1-2 x/day, 45 to 90 minutes OT Frequency: 5 out of 7 days OT Duration/Estimated Length of Stay: ~14 days SLP Intensity: Minumum of 1-2 x/day, 30 to 90 minutes SLP Frequency: 3 to 5 out of 7 days SLP Duration/Estimated Length of Stay: 14 days       Team  Interventions: Nursing Interventions Patient/Family Education, Medication Management, Skin Care/Wound Management, Discharge Planning, Psychosocial Support, Cognitive Remediation/Compensation, Dysphagia/Aspiration Precaution Training  PT interventions Ambulation/gait training, Warden/rangerBalance/vestibular training, Cognitive remediation/compensation, FirefighterCommunity reintegration, Fish farm managerDME/adaptive equipment instruction, Discharge planning, Disease management/prevention, Functional mobility training, Neuromuscular re-education, Pain management, Patient/family education, Psychosocial support, Stair training, Therapeutic Activities, Therapeutic Exercise, UE/LE Strength taining/ROM, UE/LE Coordination activities, Wheelchair propulsion/positioning  OT Interventions Warden/rangerBalance/vestibular training, Disease mangement/prevention, Neuromuscular re-education, Self Care/advanced ADL retraining, Therapeutic Exercise, Wheelchair propulsion/positioning, UE/LE Strength taining/ROM, Skin care/wound managment, Pain management, DME/adaptive equipment instruction, Cognitive remediation/compensation, Community reintegration, Equities traderatient/family education, Splinting/orthotics, UE/LE Coordination activities, Visual/perceptual remediation/compensation, Therapeutic Activities, Psychosocial support, Functional mobility training, Discharge planning  SLP Interventions Cognitive remediation/compensation, Cueing hierarchy, Functional tasks, Patient/family education, Dysphagia/aspiration precaution training, Internal/external aids, Environmental controls, Therapeutic Activities  TR Interventions    SW/CM Interventions Discharge Planning, Psychosocial Support, Patient/Family Education    Team Discharge Planning: Destination: PT-Home ,OT- Home , SLP-Home Projected Follow-up: PT-Home health PT, 24 hour supervision/assistance, OT-  24 hour supervision/assistance, Outpatient OT, SLP-24 hour supervision/assistance, Outpatient SLP, Home Health SLP Projected Equipment  Needs: PT-To be determined, OT- To be determined, SLP-None recommended by SLP Equipment Details: PT- , OT-  Patient/family involved in discharge planning: PT- Patient,  OT-Patient unable/family or caregiver not available, SLP-Patient  MD ELOS: 13-17 days Medical Rehab Prognosis:  Excellent Assessment: The patient has been admitted for CIR therapies with the diagnosis of TBI and polytrauma. The team will be addressing functional mobility, strength, stamina, balance, safety, adaptive  techniques and equipment, self-care, bowel and bladder mgt, patient and caregiver education, pain mgt, orthotic use, cognitive perceptual training, NMR, cognition, behavior, ego support. Goals have been set at supervision to mod I with self-care and mobility and min assist to supervision with cognition. The patient is still requiring an enclosure bed due to safety.    Ranelle Oyster, MD, FAAPMR      See Team Conference Notes for weekly updates to the plan of care
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
## 2016-06-15 NOTE — Progress Notes (Signed)
Speech Language Pathology Daily Session Note  Patient Details  Name: Travis Mcguire MRN: [id number] Date of Birth: [birth year]  Today's Date: 06/15/2016 SLP Individual Time: 1400-1430 SLP Individual Time Calculation (min): 30 min   Short Term Goals: Week 1: SLP Short Term Goal 1 (Week 1): Patient will consume current diet with Min A verbal cues for use of swallowing compensatory strategies. SLP Short Term Goal 2 (Week 1): Patient will demonstrate efficient mastication with trials of Dys. 3 textures without overt s/s of aspiration with Min A verbal cues for use of swallowing compensatory strategies over 2 consecutive sessions.  SLP Short Term Goal 3 (Week 1): Patient will demonstrate functional problem solving for basic and functional tasks with Mod A verbal cues. SLP Short Term Goal 4 (Week 1): Patient will self-monitor and correct errors during functional tasks with Mod A verbal and visual cues.  SLP Short Term Goal 5 (Week 1): Patient will demonstrate sustained attention to functional tasks for 30 minutes with Min A verbal cues for redirection.  SLP Short Term Goal 6 (Week 1): Patient will identify 2 physical and 2 cognitive impairments with Mod A verbal and question cues.   Skilled Therapeutic Interventions:Pt seen for skilled SLP treatment targeting cognitive and swallowing goals. Pt able to managed Dys 3 and regular texture snack with min verbal cueing without s/s of airway compromise. Pt agreeable to use of c-collar with upright positioning, but is unable to communicate purpose of collar despite max A. Pt denies any injuries or need for assistance. Max A for verbal sequencing of a functional task secondary to poor sustained attention and self-distraction. Pt pleasant and participative despite his strong desire to return home with lack of insight re: injuries.    Function:  Eating Eating   Modified Consistency Diet: Yes Eating Assist Level: Supervision or verbal cues   Eating Set Up  Assist For: Opening containers       Cognition Comprehension Comprehension assist level: Understands basic 50 - 74% of the time/ requires cueing 25 - 49% of the time  Expression   Expression assist level: Expresses basic 90% of the time/requires cueing < 10% of the time.  Social Interaction Social Interaction assist level: Interacts appropriately 50 - 74% of the time - May be physically or verbally inappropriate.  Problem Solving    Memory Memory assist level: Recognizes or recalls 25 - 49% of the time/requires cueing 50 - 75% of the time    Pain Pain Assessment Pain Assessment: No/denies pain  Therapy/Group: Individual Therapy  Rocky CraftsKara E Icelynn Onken MA, CCC-SLP 06/15/2016, 3:49 PM

## 2016-06-15 NOTE — Progress Notes (Signed)
Occupational Therapy Note  Patient Details  Name: Gustavus MessingDenzel L XXX Guertin MRN: [id number] Date of Birth: [birth year]  Today's Date: 06/15/2016 OT Individual Time: 1000-1100 OT Individual Time Calculation (min): 60 min    Pt denied pain Individual Therapy  Pt asleep in bed upon arrival but easily aroused.  Pt declined shower but agreed to shower at 1 pm.  Pt oriented to place, month, and year.  Pt experienced LOB X 1 with initial sit<>stand and required mod A to correct.  Pt transferred to w[facility]c and [facility]  Pt initially engaged in pipe tree tasks.  Pt was able to construct easy structures without assistance.  Pt assembled a moderately hard structure but was unable to use the correct pieces.  Pt required max verbal cues to make corrections.  Pt transitioned to [employer].  Pt's reaction time with red only lights was 1.28 seconds. Pt because increasingly frustrated when challenged with red and green lights with 2 second reaction limit.  Pt unable to complete task and stated that his brain "hurt." Pt consistently attempted to remove his C-collar.  Reeducated pt regarding purpose of his collar.  Pt returned to secured enclosure bed with all needs within reach.  Focus on attention, problem solving, orientation, and safety awareness to increase independence with BADLs.    [name], Halona Amstutz [facility] 06/15/2016, 12:15 PM

## 2016-06-16 ENCOUNTER — Inpatient Hospital Stay (HOSPITAL_COMMUNITY): Payer: Medicaid Other | Admitting: Speech Pathology

## 2016-06-16 ENCOUNTER — Inpatient Hospital Stay (HOSPITAL_COMMUNITY): Payer: Medicaid Other | Admitting: Occupational Therapy

## 2016-06-16 ENCOUNTER — Inpatient Hospital Stay (HOSPITAL_COMMUNITY): Payer: Self-pay | Admitting: Physical Therapy

## 2016-06-16 DIAGNOSIS — S52021D Displaced fracture of olecranon process without intraarticular extension of right ulna, subsequent encounter for closed fracture with routine healing: Secondary | ICD-10-CM

## 2016-06-16 DIAGNOSIS — S12601S Unspecified nondisplaced fracture of seventh cervical vertebra, sequela: Secondary | ICD-10-CM

## 2016-06-16 DIAGNOSIS — S52023A Displaced fracture of olecranon process without intraarticular extension of unspecified ulna, initial encounter for closed fracture: Secondary | ICD-10-CM

## 2016-06-16 NOTE — Progress Notes (Signed)
Speech Language Pathology Daily Session Note  Patient Details  Name: Travis MessingDenzel L XXX Ericksen MRN: [id number] Date of Birth: 1996/03/17  Today's Date: 06/16/2016 SLP Individual Time: 1045-1130 SLP Individual Time Calculation (min): 45 min   Short Term Goals: Week 1: SLP Short Term Goal 1 (Week 1): Patient will consume current diet with Min A verbal cues for use of swallowing compensatory strategies. SLP Short Term Goal 2 (Week 1): Patient will demonstrate efficient mastication with trials of Dys. 3 textures without overt s/s of aspiration with Min A verbal cues for use of swallowing compensatory strategies over 2 consecutive sessions.  SLP Short Term Goal 3 (Week 1): Patient will demonstrate functional problem solving for basic and functional tasks with Mod A verbal cues. SLP Short Term Goal 4 (Week 1): Patient will self-monitor and correct errors during functional tasks with Mod A verbal and visual cues.  SLP Short Term Goal 5 (Week 1): Patient will demonstrate sustained attention to functional tasks for 30 minutes with Min A verbal cues for redirection.  SLP Short Term Goal 6 (Week 1): Patient will identify 2 physical and 2 cognitive impairments with Mod A verbal and question cues.   Skilled Therapeutic Interventions: Skilled treatment session focused on dysphagia and cognition goals. SLP facilitated session by providing trials of regular diet textures. PT able to consume with complete oral clearing and no s/s of aspiration. Pt consumed thin liquids via cup without s/s of aspiration. Pt required Max A verbal cues for orientation to time and situation as well as insight into current deficits. Pt unable to recall any physical impairments and stated the he didn't have any cognitive impairments. Pt required Mod A verbal cues for attention to task and for functional problem solving for basic tasks. Pt returned to room, left in enclosure bed with cl\all bell within reach. Continue current plan of care.     Function:  Eating Eating   Modified Consistency Diet: No (trials regular with ST) Eating Assist Level: Supervision or verbal cues           Cognition Comprehension Comprehension assist level: Understands basic 50 - 74% of the time/ requires cueing 25 - 49% of the time  Expression   Expression assist level: Expresses basic 90% of the time/requires cueing < 10% of the time.  Social Interaction Social Interaction assist level: Interacts appropriately 25 - 49% of time - Needs frequent redirection.  Problem Solving Problem solving assist level: Solves basic 25 - 49% of the time - needs direction more than half the time to initiate, plan or complete simple activities  Memory Memory assist level: Recognizes or recalls 25 - 49% of the time/requires cueing 50 - 75% of the time    Pain    Therapy/Group: Individual Therapy  Zyliah Schier 06/16/2016, 12:01 PM

## 2016-06-16 NOTE — Progress Notes (Signed)
Occupational Therapy Session Note  Patient Details  Name: Travis MessingDenzel L XXX Ekstrom MRN: [id number] Date of Birth: [birth year]  Today's Date: 06/16/2016 OT Individual Time: [id number] and 1430-1500 OT Individual Time Calculation (min): 12 min  and 30 min Today's Date: 06/16/2016 OT Missed Time: 63 Minutes ( missed in morning session) Missed Time Reason: Patient unwilling/refused to participate without medical reason;Patient fatigue     Short Term Goals: Week 1:  OT Short Term Goal 1 (Week 1): Pt will be consistantly oriented x3 with external aids with supervision  OT Short Term Goal 2 (Week 1): Pt will transfer to toilet with steadying A OT Short Term Goal 3 (Week 1): Pt don shirt with min A sitting EOB OT Short Term Goal 4 (Week 1): Pt will demonstrate sustained attention to functional task with min A for 15 min  OT Short Term Goal 5 (Week 1): Pt will don pants with min A   Skilled Therapeutic Interventions/Progress Updates:    Session 1:Upon entering the room, pt supine in enclosure bed sleeping soundly. Pt easily woken and speaking to therapist in hushed tones. Pt oriented to self and location only this session. Pt declines getting out of bed. OT educating pt and orienting pt to situation and pt continues to declined therapy this morning. Pt states, " I'm tired. Maybe in an hour or two but i'm not doing anything right now. Bye." Pt remained in vale bed and RN notified.   Session 2: Upon entering the room, pt supine in bed but agreeable to engaged in OT intervention with minimal coaxing. Pt states, " I will do this for a little while but I am tired." Pt not wearing cervical brace but agreeable to wearing during therapy when asked to put it on. OT also assisting pt with donning L cam boot. Pt donning R shoe independently. Pt ambulating without assistive device and required min A to ambulate to bathroom for toileting. Pt standing to urinate with supervision during clothing management. Pt declined  ambulation further and sat self in wheelchair. Pt requiring max multi modal cues to not use R UE to propel wheelchair. Pt propelled chair to ADL apartment with supervision and verbal cues. Pt engaged in functional transfers from standard bed and couch with steady assist. Pt propels self into family room and begins buying items from vending machine. Pt shoes good problem solving by selecting items that he was able to actually purchase based on amount of money he had. However, pt did not purchase items he can currently eat based on modifications to diet. Snacks given to RN to hold. Pt returned to room and sitting on edge of bed with NT present to provide supervision for lunch meal. All needs within reach.   Therapy Documentation Precautions:  Precautions Precautions: Fall, Cervical Required Braces or Orthoses: Cervical Brace, Other Brace/Splint Cervical Brace: Hard collar Other Brace/Splint: L CAM boot for comfort, RUE splint Restrictions Weight Bearing Restrictions: Yes RUE Weight Bearing: Non weight bearing LLE Weight Bearing: Weight bearing as tolerated Other Position/Activity Restrictions: in CAM boot General: General OT Amount of Missed Time: 63 Minutes Vital Signs: Therapy Vitals Temp: 98.5 F (36.9 C) Temp Source: Oral Pulse Rate: 90 Resp: 18 BP: 120/77 Patient Position (if appropriate): Lying Oxygen Therapy SpO2: 100 % O2 Device: Not Delivered Pain:   ADL: ADL ADL Comments: see functional navigator Exercises:   Other Treatments:    See Function Navigator for Current Functional Status.   Therapy/Group: Individual Therapy  Lowella Gripittman, Cortez Steelman L 06/16/2016, 9:00  AM

## 2016-06-16 NOTE — Progress Notes (Signed)
Physical Therapy Session Note  Patient Details  Name: Travis Mcguire MRN: [id number] Date of Birth: 1996-07-20  Today's Date: 06/16/2016 PT Individual Time: 0950-1020 PT Individual Time Calculation (min): 30 min    Short Term Goals: Week 1:  PT Short Term Goal 1 (Week 1): Patient will maintain NWB RUE with mod multimodal cues with functional mobility tasks.  PT Short Term Goal 2 (Week 1): Patient will ambulate 150 ft with close supervision.  PT Short Term Goal 3 (Week 1): Patient will perform transfers with consistent supervision.  PT Short Term Goal 4 (Week 1): Patient will sustain attention to functional tasks x 15 min with mod multimodal cues.  PT Short Term Goal 5 (Week 1): Patient will identify 2 physical and 2 cognitive deficits with mod A.   Skilled Therapeutic Interventions/Progress Updates:  Pt received in bed with Aspen collar doffed and on dresser; RN reports patient refused to wear it last night.  Pt verbalizes he is agreeable to wear it during therapy.  Pt performed supine > sit supervision and therapist assisted pt with donning C-collar and CAM boot while explaining importance of use of collar to stabilize fracture and prevent further injury and prolonged hospital stay.  Pt performed sit >stand and gait to gym in controlled environment x 150' with min A with verbal cues to recall directions to gym.  Reviewed stair negotiation up/down 12 stairs x 2 reps with LUE support on rail with min A and verbal cues for step to sequence when descending.  Engaged pt in higher level gait and balance training with focus on balance while stepping over bolsters on floor x 8 reps forwards, R and L lateral stepping x 2 reps each with min A + reaching to floor to pick up bolsters with LUE and stacking them on high shelf with min A for balance with focus on sustained attention to task and to simulate job duties.   Pt performed Nustep at level 6 resistance with LE only to focus on LE strengthening,  endurance and sustained attention to task; pt only able to attend to and tolerate x 2 minutes.  Pt reporting fatigue and requesting to return to room; pt agreeable to play card game in room but once back in room pt became focused on calling family members to ask them when they are coming to visit and unable to redirect pt to card game.  Pt reporting fatigue; pt returned self to bed removing CAM boot and c-collar despite therapist's recommendation to keep C-collar donned-RN aware.  Pt performed sit >supine with supervision.       Therapy Documentation Precautions:  Precautions Precautions: Fall, Cervical Required Braces or Orthoses: Cervical Brace, Other Brace/Splint Cervical Brace: Hard collar Other Brace/Splint: L CAM boot for comfort, RUE splint Restrictions Weight Bearing Restrictions: Yes RUE Weight Bearing: Non weight bearing LLE Weight Bearing: Weight bearing as tolerated Other Position/Activity Restrictions: in CAM boot General: PT Amount of Missed Time (min): 15 Minutes PT Missed Treatment Reason: Patient fatigue Pain:  No c/o pain at rest   See Function Navigator for Current Functional Status.   Therapy/Group: Individual Therapy  Travis Mcguire, Travis Mcguire Travis Mcguire 06/16/2016, 12:35 PM
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
## 2016-06-17 DIAGNOSIS — S12600A Unspecified displaced fracture of seventh cervical vertebra, initial encounter for closed fracture: Secondary | ICD-10-CM

## 2016-06-17 DIAGNOSIS — D72829 Elevated white blood cell count, unspecified: Secondary | ICD-10-CM

## 2016-06-17 DIAGNOSIS — S42309S Unspecified fracture of shaft of humerus, unspecified arm, sequela: Secondary | ICD-10-CM

## 2016-06-17 DIAGNOSIS — S069X0S Unspecified intracranial injury without loss of consciousness, sequela: Secondary | ICD-10-CM

## 2016-06-17 DIAGNOSIS — R4589 Other symptoms and signs involving emotional state: Secondary | ICD-10-CM

## 2016-06-17 DIAGNOSIS — S069X9S Unspecified intracranial injury with loss of consciousness of unspecified duration, sequela: Secondary | ICD-10-CM

## 2016-06-17 DIAGNOSIS — S52021S Displaced fracture of olecranon process without intraarticular extension of right ulna, sequela: Secondary | ICD-10-CM

## 2016-06-17 DIAGNOSIS — R4689 Other symptoms and signs involving appearance and behavior: Secondary | ICD-10-CM

## 2016-06-17 DIAGNOSIS — Z9189 Other specified personal risk factors, not elsewhere classified: Secondary | ICD-10-CM

## 2016-06-17 DIAGNOSIS — Z789 Other specified health status: Secondary | ICD-10-CM

## 2016-06-17 LAB — GLUCOSE, CAPILLARY: GLUCOSE-CAPILLARY: 104 mg/dL — AB (ref 65–99)

## 2016-06-17 MED ORDER — PROPRANOLOL HCL 20 MG PO TABS
20.0000 mg | ORAL_TABLET | Freq: Three times a day (TID) | ORAL | Status: DC
  Administered 2016-06-17 – 2016-06-22 (×13): 20 mg via ORAL
  Filled 2016-06-17 (×16): qty 1

## 2016-06-17 NOTE — Progress Notes (Signed)
06/17/16 0800 nursing Patient ask NT to unzip enclosure bed re: want to go to bathroom. Patient then did not want to go to bathroom but wanting to go home with the girlfriend who spent the night in his room and left for the day. Patient went outside the room towards the door. Staff were with patient preventing him to go out the elevator but patient keep on pushing staff. Security was called and stayed with patient. RN prepared Ativan IM but patient was so much agitated. RN prepared Ativan po crushed and placed in apple juice but patient claims that RN placed medicine on it so he will not drink it. RN sought help with Ed RN who gave his IM shot. Patient then settled down, ate his breakfast and went to bed for most of the morning. RN able to give Symmmetrel but patient refused other po meds.

## 2016-06-17 NOTE — Progress Notes (Signed)
06/17/16 1515 nursing Patients' family in room patient is getting agitated. Mother requested for medication for patient. Ativan po taken by patient after talking to the patient.
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
## 2016-06-18 ENCOUNTER — Inpatient Hospital Stay (HOSPITAL_COMMUNITY): Payer: Self-pay | Admitting: Physical Therapy

## 2016-06-18 ENCOUNTER — Inpatient Hospital Stay (HOSPITAL_COMMUNITY): Payer: Self-pay

## 2016-06-18 ENCOUNTER — Inpatient Hospital Stay (HOSPITAL_COMMUNITY): Payer: Self-pay | Admitting: Speech Pathology

## 2016-06-18 ENCOUNTER — Encounter (HOSPITAL_COMMUNITY): Payer: Self-pay

## 2016-06-18 DIAGNOSIS — D72829 Elevated white blood cell count, unspecified: Secondary | ICD-10-CM

## 2016-06-18 DIAGNOSIS — D62 Acute posthemorrhagic anemia: Secondary | ICD-10-CM

## 2016-06-18 DIAGNOSIS — Z9189 Other specified personal risk factors, not elsewhere classified: Secondary | ICD-10-CM

## 2016-06-18 DIAGNOSIS — R451 Restlessness and agitation: Secondary | ICD-10-CM

## 2016-06-18 LAB — CBC WITH DIFFERENTIAL/PLATELET
BASOS PCT: 1 %
Basophils Absolute: 0.1 10*3/uL (ref 0.0–0.1)
EOS ABS: 0.1 10*3/uL (ref 0.0–0.7)
Eosinophils Relative: 1 %
HCT: 36.8 % — ABNORMAL LOW (ref 39.0–52.0)
HEMOGLOBIN: 11.9 g/dL — AB (ref 13.0–17.0)
Lymphocytes Relative: 21 %
Lymphs Abs: 1.9 10*3/uL (ref 0.7–4.0)
MCH: 32.6 pg (ref 26.0–34.0)
MCHC: 32.3 g/dL (ref 30.0–36.0)
MCV: 100.8 fL — ABNORMAL HIGH (ref 78.0–100.0)
MONO ABS: 0.8 10*3/uL (ref 0.1–1.0)
MONOS PCT: 8 %
NEUTROS ABS: 6.4 10*3/uL (ref 1.7–7.7)
Neutrophils Relative %: 69 %
Platelets: 310 10*3/uL (ref 150–400)
RBC: 3.65 MIL/uL — AB (ref 4.22–5.81)
RDW: 14.7 % (ref 11.5–15.5)
WBC: 9.2 10*3/uL (ref 4.0–10.5)

## 2016-06-18 LAB — BASIC METABOLIC PANEL
Anion gap: 10 (ref 5–15)
BUN: 20 mg/dL (ref 6–20)
CALCIUM: 9.7 mg/dL (ref 8.9–10.3)
CO2: 26 mmol/L (ref 22–32)
CREATININE: 1.07 mg/dL (ref 0.61–1.24)
Chloride: 101 mmol/L (ref 101–111)
GFR calc Af Amer: >60 mL/min (ref >60)
GFR calc non Af Amer: >60 mL/min (ref >60)
GLUCOSE: 82 mg/dL (ref 65–99)
Potassium: 4.7 mmol/L (ref 3.5–5.1)
Sodium: 137 mmol/L (ref 135–145)

## 2016-06-18 MED ORDER — QUETIAPINE FUMARATE 50 MG PO TABS
50.0000 mg | ORAL_TABLET | Freq: Two times a day (BID) | ORAL | Status: DC
  Administered 2016-06-18 – 2016-06-20 (×4): 50 mg
  Filled 2016-06-18 (×4): qty 1

## 2016-06-18 MED ORDER — QUETIAPINE FUMARATE 50 MG PO TABS
50.0000 mg | ORAL_TABLET | Freq: Every day | ORAL | Status: DC
  Administered 2016-06-18: 50 mg
  Filled 2016-06-18: qty 1

## 2016-06-18 MED ORDER — DIVALPROEX SODIUM 500 MG PO DR TAB
500.0000 mg | DELAYED_RELEASE_TABLET | Freq: Two times a day (BID) | ORAL | Status: DC
  Administered 2016-06-18 – 2016-06-19 (×3): 500 mg via ORAL
  Filled 2016-06-18 (×3): qty 1

## 2016-06-18 NOTE — Progress Notes (Signed)
Occupational Therapy Note  Patient Details  Name: Travis Mcguire MRN: [id number] Date of Birth: July 17, 1996  Today's Date: 06/18/2016 OT Missed Time: 30 Minutes Missed Time Reason: Patient unwilling/refused to participate without medical reason (increased agitation and threatening)   Pt missed 30 mins skilled OT services.  Pt noted with increased agitation and threatening postures when approached to participate.    [name], Myeasha Ballowe [facility] 06/18/2016, 12:02 PM
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
## 2016-06-18 NOTE — Progress Notes (Signed)
Speech Language Pathology Note  Patient Details  Name: Travis Mcguire MRN: [id number] Date of Birth: 06-09-1996 Today's Date: 06/18/2016  Patient missed 90 minutes of skilled SLP intervention due to verbal agitation due to wanting to leave the hospital and refusing to participate.    Coy Vandoren 06/18/2016, 1:00 PM
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
## 2016-06-18 NOTE — Progress Notes (Signed)
Social Work Patient ID: Travis Mcguire, male   DOB: 1996-04-03, 20 y.o.   MRN: [id number]   Alerted this morning of the events this morning with pt attempting to elope.  MD has now put in place an IVC and sitter at bedside.  Further review of pt's chart notes documenting  2 suicide attempts by pt in 11/2014 and 08/2015.  Able to speak with mother this morning and she is aware of the IVC and, again, expressing concern about being able to manage his behavior at home.  Concern is certainly valid given current and past behaviors.  Psychiatry to be involved for further recommendations.  Elita Dame, LCSW

## 2016-06-18 NOTE — Progress Notes (Signed)
Occupational Therapy Note  Patient Details  Name: Travis Mcguire MRN: [id number] Date of Birth: 01/05/1996  Today's Date: 06/18/2016 OT Individual Time: 1300-1315 OT Individual Time Calculation (min): 15 min  and Today's Date: 06/18/2016 OT Missed Time: 45 Minutes Missed Time Reason: Patient unwilling/refused to participate without medical reason;Other (comment) (Pt noncompliant with donning RUE splint to activiely particiipate)    Pt denied pain Individual Therapy  Pt up in room with CAM boot and no C-collar or RUE splint.  Pt placing sheets on extra bed in room.  Reeducated pt on importance of wearing C-collar and splint.  Pt stated he was going to do whatever wanted to do.  Pt stated he wanted to take a shower.  Pt reeducated on importance of donning splint and covering with plastic prior to taking a shower.  Pt stated it was ok to get it wet because he had done that before.  Pt informed that he couldn't take a shower unless his RUE was protected.  Pt stated that he would do whatever "the f*ck he wanted and no one could stop him."  Pt missed remaining minutes of session secondary to pt's unwillingness to comply to medical precautions.   [name], Hebe Merriwether [facility] 06/18/2016, 2:39 PM
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
## 2016-06-18 NOTE — Care Management Note (Signed)
[facility] Individual Statement of Services  Patient Name:  Travis MessingDenzel L XXX Andreatta  Date:  06/15/2016  Welcome to the [facility].  Our goal is to provide you with an individualized program based on your diagnosis and situation, designed to meet your specific needs.  With this comprehensive rehabilitation program, you will be expected to participate in at least 3 hours of rehabilitation therapies Monday-Friday, with modified therapy programming on the weekends.  Your rehabilitation program will include the following services:  Physical Therapy (PT), Occupational Therapy (OT), Speech Therapy (ST), 24 hour per day rehabilitation nursing, Therapeutic Recreaction (TR), Psychology, Neuropsychology, Case Management (Social Worker), Rehabilitation Medicine, Nutrition Services and Pharmacy Services  Weekly team conferences will be held on Tuesdays to discuss your progress.  Your Social Worker will talk with you frequently to get your input and to update you on team discussions.  Team conferences with you and your family in attendance may also be held.  Expected length of stay: 14 days  Overall anticipated outcome: supervision  Depending on your progress and recovery, your program may change. Your Social Worker will coordinate services and will keep you informed of any changes. Your Social Worker's name and contact numbers are listed  below.  The following services may also be recommended but are not provided by the [facility]:   Driving Evaluations  Home Health Rehabiltiation Services  Outpatient Rehabilitation Services  Vocational Rehabilitation   Arrangements will be made to provide these services after discharge if needed.  Arrangements include referral to agencies that provide these services.  Your insurance has been verified to be:  None (medicaid and SSD applications are pending)  Your primary doctor is:  None  Pertinent information  will be shared with your doctor and your insurance company.  Social Worker:  CliftonLucy Daundre Biel, [place] [phone] or ([phone]) [phone]   Information discussed with and copy given to patient by: Amada JupiterHOYLE, Kylina Vultaggio, 06/15/2016, 4:20 PM

## 2016-06-19 ENCOUNTER — Inpatient Hospital Stay (HOSPITAL_COMMUNITY): Payer: Medicaid Other | Admitting: Speech Pathology

## 2016-06-19 ENCOUNTER — Inpatient Hospital Stay (HOSPITAL_COMMUNITY): Payer: Self-pay | Admitting: Occupational Therapy

## 2016-06-19 ENCOUNTER — Inpatient Hospital Stay (HOSPITAL_COMMUNITY): Payer: Self-pay

## 2016-06-19 ENCOUNTER — Inpatient Hospital Stay (HOSPITAL_COMMUNITY): Payer: Self-pay | Admitting: Physical Therapy

## 2016-06-19 MED ORDER — NALOXONE HCL 0.4 MG/ML IJ SOLN
0.4000 mg | INTRAMUSCULAR | Status: DC | PRN
  Filled 2016-06-19: qty 1

## 2016-06-19 MED ORDER — HALOPERIDOL LACTATE 5 MG/ML IJ SOLN
2.0000 mg | Freq: Four times a day (QID) | INTRAMUSCULAR | Status: DC | PRN
  Administered 2016-06-22 – 2016-07-04 (×6): 2 mg via INTRAMUSCULAR
  Filled 2016-06-19 (×10): qty 1

## 2016-06-19 MED ORDER — LORAZEPAM 2 MG/ML IJ SOLN
2.0000 mg | Freq: Once | INTRAMUSCULAR | Status: AC
  Administered 2016-06-19: 2 mg via INTRAMUSCULAR

## 2016-06-19 MED ORDER — LORAZEPAM 1 MG PO TABS
2.0000 mg | ORAL_TABLET | ORAL | Status: DC | PRN
  Administered 2016-06-21 – 2016-07-13 (×19): 2 mg via ORAL
  Filled 2016-06-19 (×23): qty 2

## 2016-06-19 MED ORDER — LORAZEPAM 2 MG/ML IJ SOLN
2.0000 mg | Freq: Once | INTRAMUSCULAR | Status: AC
  Administered 2016-06-19: 2 mg via INTRAVENOUS

## 2016-06-19 MED ORDER — HALOPERIDOL LACTATE 5 MG/ML IJ SOLN
2.0000 mg | Freq: Once | INTRAMUSCULAR | Status: AC
  Administered 2016-06-19: 2 mg via INTRAMUSCULAR
  Filled 2016-06-19: qty 1

## 2016-06-19 MED ORDER — HALOPERIDOL 2 MG PO TABS
2.0000 mg | ORAL_TABLET | Freq: Once | ORAL | Status: AC
  Filled 2016-06-19: qty 1

## 2016-06-19 MED ORDER — QUETIAPINE FUMARATE 100 MG PO TABS
100.0000 mg | ORAL_TABLET | Freq: Every day | ORAL | Status: DC
  Administered 2016-06-20 – 2016-06-28 (×9): 100 mg
  Filled 2016-06-19 (×10): qty 1

## 2016-06-19 MED ORDER — LORAZEPAM 2 MG/ML IJ SOLN
2.0000 mg | INTRAMUSCULAR | Status: DC | PRN
  Administered 2016-06-28 – 2016-07-04 (×4): 2 mg via INTRAMUSCULAR
  Filled 2016-06-19 (×4): qty 1

## 2016-06-19 MED ORDER — HALOPERIDOL 2 MG PO TABS
2.0000 mg | ORAL_TABLET | Freq: Four times a day (QID) | ORAL | Status: DC | PRN
  Administered 2016-06-24 – 2016-07-08 (×9): 2 mg via ORAL
  Filled 2016-06-19 (×14): qty 1

## 2016-06-19 MED ORDER — DIVALPROEX SODIUM 500 MG PO DR TAB
500.0000 mg | DELAYED_RELEASE_TABLET | Freq: Three times a day (TID) | ORAL | Status: DC
  Administered 2016-06-20 – 2016-07-14 (×67): 500 mg via ORAL
  Filled 2016-06-19 (×75): qty 1

## 2016-06-19 NOTE — Progress Notes (Signed)
Physical Therapy Note  Patient Details  Name: Travis Mcguire MRN: [id number] Date of Birth: Jun 18, 1996 Today's Date: 06/19/2016  Patient missed 60 min due to increased agitation and aggressive behavior. Will f/u as able per POC.    Bayard HuggerVarner, Travis Mcguire 06/19/2016, 1:18 PM

## 2016-06-19 NOTE — Progress Notes (Signed)
Speech Language Pathology Daily Session Note  Patient Details  Name: Travis Mcguire MRN: [id number] Date of Birth: 04-Sep-1996  Today's Date: 06/19/2016 SLP Individual Time: 0730-0825 SLP Individual Time Calculation (min): 55 min   Short Term Goals: Week 1: SLP Short Term Goal 1 (Week 1): Patient will consume current diet with Min A verbal cues for use of swallowing compensatory strategies. SLP Short Term Goal 2 (Week 1): Patient will demonstrate efficient mastication with trials of Dys. 3 textures without overt s/s of aspiration with Min A verbal cues for use of swallowing compensatory strategies over 2 consecutive sessions.  SLP Short Term Goal 3 (Week 1): Patient will demonstrate functional problem solving for basic and functional tasks with Mod A verbal cues. SLP Short Term Goal 4 (Week 1): Patient will self-monitor and correct errors during functional tasks with Mod A verbal and visual cues.  SLP Short Term Goal 5 (Week 1): Patient will demonstrate sustained attention to functional tasks for 30 minutes with Min A verbal cues for redirection.  SLP Short Term Goal 6 (Week 1): Patient will identify 2 physical and 2 cognitive impairments with Mod A verbal and question cues.   Skilled Therapeutic Interventions: Skilled treatment session focused on dysphagia and cognitive goals. SLP facilitated session by providing skilled observation with trials of regular textures. Patient demonstrated efficient mastication without overt s/s of aspiration and required verbal cues for use of small bites. Recommend patient upgrade to regular textures. SLP also facilitated session by generating a list of items that patient should complete in order to "show" the treatment team he is ready for discharge. Patient required total A to generate list items and Max A verbal cues for sustained attention to task for ~60 seconds. Patient perseverative on going home and potential d/c date and was not easily redirected. Patient  left with girlfriend and sitter present. Continue with current plan of care.   Function:  Eating Eating   Modified Consistency Diet: No Eating Assist Level: Supervision or verbal cues           Cognition Comprehension Comprehension assist level: Understands basic 90% of the time/cues < 10% of the time  Expression   Expression assist level: Expresses basic needs/ideas: With no assist  Social Interaction Social Interaction assist level: Interacts appropriately 50 - 74% of the time - May be physically or verbally inappropriate.  Problem Solving Problem solving assist level: Solves basic 25 - 49% of the time - needs direction more than half the time to initiate, plan or complete simple activities  Memory Memory assist level: Recognizes or recalls 25 - 49% of the time/requires cueing 50 - 75% of the time    Pain No/Denies Pain  Therapy/Group: Individual Therapy  Malayna Noori 06/19/2016, 3:04 PM
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
## 2016-06-19 NOTE — Progress Notes (Signed)
Occupational Therapy Session Note  Patient Details  Name: Travis Mcguire MRN: [id number] Date of Birth: 12/25/1995  Today's Date: 06/19/2016 OT Individual Time: [id number] OT Individual Time Calculation (min): 57 min     Short Term Goals: Week 1:  OT Short Term Goal 1 (Week 1): Pt will be consistantly oriented x3 with external aids with supervision  OT Short Term Goal 2 (Week 1): Pt will transfer to toilet with steadying A OT Short Term Goal 3 (Week 1): Pt don shirt with min A sitting EOB OT Short Term Goal 4 (Week 1): Pt will demonstrate sustained attention to functional task with min A for 15 min  OT Short Term Goal 5 (Week 1): Pt will don pants with min A   Skilled Therapeutic Interventions/Progress Updates:    Pt resting in recliner upon arrival with sitter present.  Pt asked "what are we going to do today?" Pt agreeable to bathing at shower level but "needed" to make some phone calls.Pt called his Aunt and asked her if she was coming up today. She informed pt that she had a MD appointment but needed a ride.  Pt asked therapist if the hospital would give her a ride.  Pt subsequently called his girlfriend before calling his Aunt again.  Pt required max verbal cues to end conversations in order to complete shower in a timely manner.  Pt required max verbal cues for safety awareness while showering.  Pt doffed his cervical collar and CAM boot before taking shower and required max verbal cues to don after shower.  Pt required max verbal cues to attend to tasks during shower.  Pt hyperverbal and tangential throughout session.  Pt asked frequently if he was doing what was needed to "get out of here." Reeducated pt on importance of consistency and cooperation with all staff members.  Pt became argumentative after shower regarding the wound on his R elbow and attempted to wrap gauze around his arm.  Pt said he wasn't going to wait for the nurse.  Discussed with pt that behaviors were argumentative  and counterproductive for achieving goal of going home.  Pt stated he was not being argumentative.  Pt required orientation to city X 2 during session.  Focus on activity tolerance, safety awareness, cooperative participation in therapy, review of therapy goals, and attention to task to increase independence with BADLs.   Therapy Documentation Precautions:  Precautions Precautions: Fall, Cervical Required Braces or Orthoses: Cervical Brace, Other Brace/Splint Cervical Brace: Hard collar Other Brace/Splint: L CAM boot for comfort, RUE splint Restrictions Weight Bearing Restrictions: Yes RUE Weight Bearing: Non weight bearing (non-compliant/combative) RLE Weight Bearing: Weight bearing as tolerated LLE Weight Bearing: Weight bearing as tolerated Other Position/Activity Restrictions: in CAM boot   Pain:  Pr c/o discomfort in R elbow; RN aware; RUE splint offered for comfort but pt declined ADL: ADL ADL Comments: see functional navigator  See Function Navigator for Current Functional Status.   Therapy/Group: Individual Therapy  [name], Travis Mcguire 06/19/2016, 10:01 AM

## 2016-06-19 NOTE — Progress Notes (Signed)
Occupational Therapy Note  Patient Details  Name: Gustavus MessingDenzel L XXX Batres MRN: [id number] Date of Birth: 11-16-1995  Today's Date: 06/19/2016 OT Individual Time: 1130-1200 OT Individual Time Calculation (min): 30 min    1:1 Pt indicated no pain in session   When arrived pt stomach down laying in reclined chair "napping."  Focus of session was to dressing right elbow wound and redress right Ue splint - pt did allow this.  Pt also allowed therapist to don c- collar (was not wearing it when arrived).  Pt required constant redirection to tasks and attention to task with max -total A. Redirected pt to 3 therapeutic team goals with total A; goals to achieve to be able to d/c. Pt ambulated to and from bathroom with close supervision for safety (tripping over blankets) and to wash hands. Pt began raising his voice when talking with his family on the phone. Pt very eager to know when he can go home.    [name], Punam Broussard [facility] 06/19/2016, 1:49 PM
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
## 2016-06-20 ENCOUNTER — Inpatient Hospital Stay (HOSPITAL_COMMUNITY): Payer: Self-pay

## 2016-06-20 ENCOUNTER — Inpatient Hospital Stay (HOSPITAL_COMMUNITY): Payer: Medicaid Other | Admitting: Physical Therapy

## 2016-06-20 ENCOUNTER — Inpatient Hospital Stay (HOSPITAL_COMMUNITY): Payer: Self-pay | Admitting: Speech Pathology

## 2016-06-20 MED ORDER — QUETIAPINE FUMARATE 100 MG PO TABS
100.0000 mg | ORAL_TABLET | Freq: Two times a day (BID) | ORAL | Status: DC
  Administered 2016-06-20 – 2016-07-03 (×26): 100 mg
  Filled 2016-06-20 (×30): qty 1

## 2016-06-20 NOTE — Progress Notes (Signed)
Physical Therapy Session Note  Patient Details  Name: Travis Mcguire MRN: [id number] Date of Birth: Jun 25, 1996  Today's Date: 06/20/2016 PT Individual Time: 1350-1420 PT Individual Time Calculation (min): 30 min    Short Term Goals: Week 1:  PT Short Term Goal 1 (Week 1): Patient will maintain NWB RUE with mod multimodal cues with functional mobility tasks.  PT Short Term Goal 2 (Week 1): Patient will ambulate 150 ft with close supervision.  PT Short Term Goal 3 (Week 1): Patient will perform transfers with consistent supervision.  PT Short Term Goal 4 (Week 1): Patient will sustain attention to functional tasks x 15 min with mod multimodal cues.  PT Short Term Goal 5 (Week 1): Patient will identify 2 physical and 2 cognitive deficits with mod A.   Skilled Therapeutic Interventions/Progress Updates:    Patient received ambulating in hallway without CAM boot, cervical collar, or RUE splint with sitter, CSW, Publishing copynursing director, and OT with intent of eloping from hospital. Patient redirected away from emergency exit doors at stairwell and able to be redirected past elevators before reaching [employer] at which time he requested to sit in wheelchair being pushed by PT due to pain and fatigue. Patient then redirected to returning to room in wheelchair and allowed sitter to push him back towards rehab unit. At 4W elevators, patient stood from wheelchair and got on elevators but was able to be directed back to wheelchair with multiple attempts. Patient sat in wheelchair and attended to eating snacks and was agreeable to allowing PT to don CAM boot, cervical collar, and splint. Patient continued to perseverate on making phone calls, asking each passerby in hallway for phone or asking for doctor. Patient reporting fatigue, removed collar despite cues to keep it on to prevent further cervical injury and promote healing, and returned to bed with supervision. Patient left in open enclosure bed with sitter  present.   Therapy Documentation Precautions:  Precautions Precautions: Fall, Cervical Required Braces or Orthoses: Cervical Brace, Other Brace/Splint Cervical Brace: Hard collar Other Brace/Splint: L CAM boot for comfort, RUE splint Restrictions Weight Bearing Restrictions: Yes RUE Weight Bearing: Non weight bearing LUE Weight Bearing: Weight bearing as tolerated RLE Weight Bearing: Weight bearing as tolerated LLE Weight Bearing: Weight bearing as tolerated Other Position/Activity Restrictions: in CAM boot General:  Patient missed 45 min due to fatigue  Pain: No c/o pain  See Function Navigator for Current Functional Status.   Therapy/Group: Individual Therapy  Kerney ElbeVarner, Durwood Dittus A 06/20/2016, 5:23 PM

## 2016-06-20 NOTE — Progress Notes (Signed)
Social Work Patient ID: Travis Mcguire, male   DOB: 1995/11/24, 20 y.o.   MRN: [id number]   Full tx team including MD able to meet with pt's mother yesterday following team conference.  Each team member reviewed their roles and what they are currently working on with pt.  MD and team also discussing the behavioral concerns and the management plan we have put in place at this time.  Mother in full agreement with plans.  She remain very concerned about his elopement risk here as well as possibility that he will continue this behavior at her home.  At this time, team and MD hopeful we will be able to make appropriate medication adjustments and assist pt through this phase with behavior mod plans.  Continue to follow.  Donnalynn Wheeless, LCSW

## 2016-06-20 NOTE — Progress Notes (Signed)
Several incidents of agitation noted today and two episodes of leaving room and could not be talked back to room. 1630 Staff intervened pt in stairwell of [place] floor and able to talk pt down enough to walk himself back to the room and placed in enclosure bed. 1900 pt awoke by noise in hall and startled, requested out of enclosure bed for toileting and when staff allow out, pt ran out of the room and NT unable to catch and return or deter pt from running out the back side stairwell.Staff followed and caught pt in stairwell, attempted to use CPI and walk pt back to room noted he was too tired and had wheelchair brought to 1st floor and returned pt to his room via wheelchair. Security met staff on unit and assured staff ok and pt in enclosure bed.  Travis Mcguire

## 2016-06-20 NOTE — Progress Notes (Signed)
Occupational Therapy Session Note  Patient Details  Name: Travis Mcguire MRN: [id number] Date of Birth: 1996-07-03  Today's Date: 06/20/2016 OT Individual Time: 4098-11910900-0945 OT Individual Time Calculation (min): 45 min  and Today's Date: 06/20/2016 OT Missed Time: 15 Minutes Missed Time Reason: Patient fatigue     Short Term Goals: Week 1:  OT Short Term Goal 1 (Week 1): Pt will be consistantly oriented x3 with external aids with supervision  OT Short Term Goal 2 (Week 1): Pt will transfer to toilet with steadying A OT Short Term Goal 3 (Week 1): Pt don shirt with min A sitting EOB OT Short Term Goal 4 (Week 1): Pt will demonstrate sustained attention to functional task with min A for 15 min  OT Short Term Goal 5 (Week 1): Pt will don pants with min A   Skilled Therapeutic Interventions/Progress Updates:    Pt engaged in sitting/standing tasks to participate in Wii games (Just Dance and bowling). Pt attended to each game approx 10 mins before taking breaks to eat his biscuit and donut.  Pt required max verbal cues to don his RUE splint and C-collar.  Pt persistently requested use of telephone to call his girlfriend but was easily redirected/distracted.  After approx 40 mins activity/eating pt stated he was tired and needed to lay down in bed.  Pt was able to arrange his pillows and blankets appropriately prior to laying back into enclosure bed.  Pt remained in bed with sitter present. Pt missed 15 mins skilled OT services secondary to fatigue.  Therapy Documentation Precautions:  Precautions Precautions: Fall, Cervical Required Braces or Orthoses: Cervical Brace, Other Brace/Splint Cervical Brace: Hard collar Other Brace/Splint: L CAM boot for comfort, RUE splint Restrictions Weight Bearing Restrictions: Yes RUE Weight Bearing: Non weight bearing RLE Weight Bearing: Weight bearing as tolerated LLE Weight Bearing: Weight bearing as tolerated Other Position/Activity Restrictions: in  CAM boot General: General OT Amount of Missed Time: 15 Minutes Vital Signs: Therapy Vitals Temp: 98 F (36.7 C) Temp Source: Oral Pulse Rate: 61 Resp: 18 BP: 124/64 Patient Position (if appropriate): Lying Oxygen Therapy SpO2: 100 % O2 Device: Not Delivered Pain:   ADL: ADL ADL Comments: see functional navigator Exercises:   Other Treatments:    See Function Navigator for Current Functional Status.   Therapy/Group: Individual Therapy  [name], Amarachi Kotz Chappell 06/20/2016, 9:57 AM
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
## 2016-06-20 NOTE — Progress Notes (Signed)
SLP Cancellation Note  Patient Details Name: Travis Mcguire MRN: [id number] DOB: [birth year]   Cancelled treatment:       Patient missed 45 minutes of skilled SLP intervention due to fatigue. Patient received medications for agitation (ativan) which has impacted his arousal and ability to participate in treatment.                                                                                                Andrienne Havener 06/20/2016, 12:18 PM

## 2016-06-20 NOTE — Progress Notes (Signed)
SLP Cancellation Note  Patient Details Name: Travis Mcguire MRN: [id number] DOB: [birth year]   Cancelled treatment:      Pt missed 30 minute session. Pt was sleeping and unable to participate in session.                                                                                            Danaja Lasota 06/20/2016, 1:07 PM

## 2016-06-21 ENCOUNTER — Inpatient Hospital Stay (HOSPITAL_COMMUNITY): Payer: Medicaid Other | Admitting: Speech Pathology

## 2016-06-21 ENCOUNTER — Inpatient Hospital Stay (HOSPITAL_COMMUNITY): Payer: Self-pay

## 2016-06-21 ENCOUNTER — Inpatient Hospital Stay (HOSPITAL_COMMUNITY): Payer: Medicaid Other | Admitting: Physical Therapy

## 2016-06-21 DIAGNOSIS — F918 Other conduct disorders: Secondary | ICD-10-CM

## 2016-06-21 DIAGNOSIS — S069X0S Unspecified intracranial injury without loss of consciousness, sequela: Secondary | ICD-10-CM

## 2016-06-21 NOTE — Progress Notes (Signed)
Physical Therapy Weekly Progress Note  Patient Details  Name: Travis Mcguire MRN: [id number] Date of Birth: March 27, 1996  Beginning of progress report period: June 14, 2016 End of progress report period: June 21, 2016  Today's Date: 06/21/2016 PT Individual Time:1305-1400  55 minutes  Patient has met 2 of 5 short term goals. Patient currently demonstrating behaviors consistent with Rancho Level IV-emerging V. Patient requires supervision overall without device for mobility, without use of cervical collar, arm splint, and CAM boot the majority of time despite max cues and education from all staff members caring for patient. Patient has made multiple attempts at elopement from hospital but eventually pain and fatigue result in him being transported or redirected back to rehab unit. Patient is restless and perseverative with increased agitation resulting in aggression and threats of harm to staff. Patient requires max multimodal cues overall for sustained attention, functional recall, problem solving, and intellectual awareness of deficits. Patient continues to require enclosure bed and sitter 24/7 to ensure safety in room. Treatment team held family conference with mother Levada Dy this week and frequently updated behavioral plan for all staff who interact with patient to address patient care and safety.   Patient continues to demonstrate the following deficits: muscle joint tightness, decreased cardiorespiratory endurance, decreased attention, decreased awareness, decreased problem solving, decreased safety awareness, decreased memory and delayed processing and decreased standing balance, decreased balance strategies and difficulty maintaining precautions and therefore will continue to benefit from skilled PT intervention to enhance overall performance with activity tolerance, balance, postural control, ability to compensate for deficits, functional use of  right lower extremity, left upper  extremity and left lower extremity, attention, awareness, coordination and knowledge of precautions.  Patient progressing toward long term goals.  Continue plan of care.  PT Short Term Goals Week 1:  PT Short Term Goal 1 (Week 1): Patient will maintain NWB RUE with mod multimodal cues with functional mobility tasks.  PT Short Term Goal 1 - Progress (Week 1): Not met PT Short Term Goal 2 (Week 1): Patient will ambulate 150 ft with close supervision.  PT Short Term Goal 2 - Progress (Week 1): Met PT Short Term Goal 3 (Week 1): Patient will perform transfers with consistent supervision.  PT Short Term Goal 3 - Progress (Week 1): Met PT Short Term Goal 4 (Week 1): Patient will sustain attention to functional tasks x 15 min with mod multimodal cues.  PT Short Term Goal 4 - Progress (Week 1): Not met PT Short Term Goal 5 (Week 1): Patient will identify 2 physical and 2 cognitive deficits with mod A.  PT Short Term Goal 5 - Progress (Week 1): Not met Week 2:  PT Short Term Goal 1 (Week 2): Patient will sustain attention x 15 min with max multimodal cues.  PT Short Term Goal 2 (Week 2): Patient will wear braces (cervical collar, arm splint, CAM boot) 50% of time with max verbal cues.  PT Short Term Goal 3 (Week 2): Patient will identify 2 physical and 2 cognitive deficits with max multimodal cues.    Skilled Therapeutic Interventions/Progress Updates:    Patient asleep upon arrival with sitter present, requiring increased time for arousal and participation. Patient eventually sat EOB and agreeable to donning cervical collar with max multimodal cues for correct placement and RUE splint with total cues to orient splint to arm but refused to don CAM boot, donning B shoes instead. Patient intermittently finishing lunch meal between therapeutic activities. Patient recalled earlier OT session  with "Mr. Tom." Patient sustained attention to Wii Boxing x 10 min but switched game after losing to this PT. Patient  sustained attention to Wii golf < 10 min and finished lunch meal. Patient initiated conversation regarding good behavior but then began perseverating on leaving this date as he got "good reports" from all therapies and stated, "I didn't even try to leave the hospital today." Reinforced need for continued good behavior and progress toward therapy goals. Patient verbalized understanding. Patient fatigued and after straightening up linens and pillows, returned to enclosure bed with cervical collar and arm splint doffed despite cues for need to wear braces. Patient internally and externally distracted throughout session. Patient left in enclosure bed with sitter present.   Therapy Documentation Precautions:  Precautions Precautions: Fall, Cervical Required Braces or Orthoses: Cervical Brace, Other Brace/Splint Cervical Brace: Hard collar Other Brace/Splint: L CAM boot for comfort, RUE splint Restrictions Weight Bearing Restrictions: Yes RUE Weight Bearing: Non weight bearing LUE Weight Bearing: Weight bearing as tolerated RLE Weight Bearing: Weight bearing as tolerated LLE Weight Bearing: Weight bearing as tolerated Other Position/Activity Restrictions: in CAM boot Pain: Denies pain  See Function Navigator for Current Functional Status.  Therapy/Group: Individual Therapy  Laretta Alstrom 06/21/2016, 9:17 PM

## 2016-06-21 NOTE — Progress Notes (Signed)
Speech Language Pathology Weekly Progress and Session Note  Patient Details  Name: Travis Mcguire MRN: [id number] Date of Birth: 02/12/1996  Beginning of progress report period: June 14, 2016 End of progress report period: June 21, 2016  Today's Date: 06/21/2016 SLP Individual Time: 0905-1000 SLP Individual Time Calculation (min): 55 min   Short Term Goals: Week 1: SLP Short Term Goal 1 (Week 1): Patient will consume current diet with Min A verbal cues for use of swallowing compensatory strategies. SLP Short Term Goal 1 - Progress (Week 1): Met SLP Short Term Goal 2 (Week 1): Patient will demonstrate efficient mastication with trials of Dys. 3 textures without overt s/s of aspiration with Min A verbal cues for use of swallowing compensatory strategies over 2 consecutive sessions.  SLP Short Term Goal 2 - Progress (Week 1): Met SLP Short Term Goal 3 (Week 1): Patient will demonstrate functional problem solving for basic and functional tasks with Mod A verbal cues. SLP Short Term Goal 3 - Progress (Week 1): Progressing toward goal SLP Short Term Goal 4 (Week 1): Patient will self-monitor and correct errors during functional tasks with Mod A verbal and visual cues.  SLP Short Term Goal 4 - Progress (Week 1): Progressing toward goal SLP Short Term Goal 5 (Week 1): Patient will demonstrate sustained attention to functional tasks for 30 minutes with Min A verbal cues for redirection.  SLP Short Term Goal 5 - Progress (Week 1): Progressing toward goal SLP Short Term Goal 6 (Week 1): Patient will identify 2 physical and 2 cognitive impairments with Mod A verbal and question cues.  SLP Short Term Goal 6 - Progress (Week 1): Progressing toward goal    New Short Term Goals: Week 2: SLP Short Term Goal 1 (Week 2): Patient will demonstrate functional problem solving for basic and functional tasks with Mod A verbal cues. SLP Short Term Goal 2 (Week 2): Patient will self-monitor and  correct errors during functional tasks with Mod A verbal and visual cues.  SLP Short Term Goal 3 (Week 2): Patient will demonstrate sustained attention to functional tasks for 30 minutes with Min A verbal cues for redirection.  SLP Short Term Goal 4 (Week 2): Patient will identify 2 physical and 2 cognitive impairments with Mod A verbal and question cues.   Weekly Progress Updates:  Pt's progress has been somewhat limited by behavioral limitations, however pt is demonstrating slow improvement with ability to participate with behavior plan in place. Pt's awareness of his limitations remains quite poor and he continues to require 24/7 supervision.   Intensity: Minumum of 1-2 x/day, 30 to 90 minutes Frequency: 3 to 5 out of 7 days Duration/Length of Stay: 14 days Treatment/Interventions: Cognitive remediation/compensation;Cueing hierarchy;Functional tasks;Patient/family education;Dysphagia/aspiration precaution training;Internal/external aids;Environmental controls;Therapeutic Activities   Daily Session  Skilled Therapeutic Interventions: Mod- Max A to maintain attention to task. Pt with frequent episodes of self-distraction and environmental distraction. Functional coin adding completed with 70% acc and mod A for organization. 2 digit addition/subtraction completed with 50% acc mod I.    Function:   Eating Eating                 Cognition Comprehension Comprehension assist level: Understands basic 75 - 89% of the time/ requires cueing 10 - 24% of the time  Expression   Expression assist level: Expresses basic needs/ideas: With no assist  Social Interaction Social Interaction assist level: Interacts appropriately 25 - 49% of time - Needs frequent redirection.  Problem Solving  Problem solving assist level: Solves basic 25 - 49% of the time - needs direction more than half the time to initiate, plan or complete simple activities  Memory Memory assist level: Recognizes or recalls 25 - 49%  of the time/requires cueing 50 - 75% of the time   General    Pain Pain Assessment Pain Assessment: No/denies pain Pain Score: 0-No pain Faces Pain Scale: Hurts a little bit Pain Type: Acute pain Pain Location: Leg Pain Orientation: Left Pain Frequency: Intermittent Pain Onset: Gradual Patients Stated Pain Goal: 1 Pain Intervention(s): Medication (See eMAR) (premedicate for therapy)  Therapy/Group: Individual Therapy  Vinetta Bergamo MA, CCC-SLP 06/21/2016, 12:10 PM

## 2016-06-21 NOTE — Progress Notes (Signed)
Occupational Therapy Session Note  Patient Details  Name: Travis MessingDenzel L XXX Loveday MRN: [id number] Date of Birth: [birth year]  Today's Date: 06/21/2016 OT Individual Time: 0800-0900 OT Individual Time Calculation (min): 60 min     Short Term Goals: Week 2:  OT Short Term Goal 1 (Week 2): Pt will demonstrate sustained attention to functional task with min A for 15 min OT Short Term Goal 2 (Week 2): Pt will activelyand consistently (5/7 sessions) engage/participate in OT sessions without arguing with therapist OT Short Term Goal 3 (Week 2): Pt will not remove his C-collar and CAM boot except when instructed by therapists or other staff OT Short Term Goal 4 (Week 2): Pt will not attempt to elope from unit during the next reporting period  Skilled Therapeutic Interventions/Progress Updates:    Pt resting in bed eating breakfast upon arrival with Sitter present.  Pt finished breakfast and engaged in sitting and standing activities with Wii (bowling). Pt required max verbal cues to don C-collar, RUE splint, and L CAM boot.  Pt maintained sustained attention to Wii game for approx 12 mins before stating he was tired and needed to rest.  Pt asked for phone X 1 but accepted explanation that MD wasn't allowing it yet.  Pt did not exhibit any unsafe behaviors or attempt to exit the room during this session.  Focus on safety awareness, attention to task, activity tolerance, and standing balance to increase independence with BADLs. Therapy Documentation Precautions:  Precautions Precautions: Fall, Cervical Required Braces or Orthoses: Cervical Brace, Other Brace/Splint Cervical Brace: Hard collar Other Brace/Splint: L CAM boot for comfort, RUE splint Restrictions Weight Bearing Restrictions: Yes RUE Weight Bearing: Non weight bearing LUE Weight Bearing: Weight bearing as tolerated RLE Weight Bearing: Weight bearing as tolerated LLE Weight Bearing: Weight bearing as tolerated Other Position/Activity  Restrictions: in CAM boot General:   Vital Signs: Therapy Vitals Temp: 98.2 F (36.8 C) Temp Source: Oral Pulse Rate: 91 Resp: 16 BP: (!) 110/58 Patient Position (if appropriate): Lying Oxygen Therapy SpO2: 100 % O2 Device: Not Delivered Pain: Pain Assessment Pain Assessment: Faces Pain Score: 0-No pain Faces Pain Scale: Hurts a little bit Pain Type: Acute pain Pain Location: Leg Pain Orientation: Left Pain Descriptors / Indicators: Aching Pain Frequency: Intermittent Pain Onset: Gradual Patients Stated Pain Goal: 1 Pain Intervention(s): Medication (See eMAR) (premedicate for therapy) ADL: ADL ADL Comments: see functional navigator Exercises:   Other Treatments:    See Function Navigator for Current Functional Status.   Therapy/Group: Individual Therapy  [name], Rosalea Withrow Chappell 06/21/2016, 9:02 AM

## 2016-06-21 NOTE — Progress Notes (Signed)
Occupational Therapy Weekly Progress Note  Patient Details  Name: Travis Mcguire MRN: [id number] Date of Birth: July 24, 1996  Beginning of progress report period: June 14, 2016 End of progress report period: June 21, 2016  Patient has met 4 of 5 short term goals.  Although pt met his BADL STGs during the past week, he continues to attempt to elope from the unit.  Pt continues to be argumentative and exhibit restless behaviors, perseverating on calling family/friends to come pick him up.  Pt has exhibited aggressive verbal behaviors and threatened to cause bodily harm to staff.  Pt consistently requires max verbal cues to attend to task and redirect to tasks with the exception of one session when engaged in Wii games for approx 10 mins X 2. Pt currently has an IVC and sitter present in room. Pt exhibits behaviors consistent with [place] Level IV/V.  Patient continues to demonstrate the following deficits: muscle weakness and acute pain, decreased cardiorespiratoy endurance, decreased attention, decreased awareness, decreased problem solving, decreased safety awareness, decreased memory and ability to maintain medical precautions and decreased balance strategies and difficulty maintaining precautions and therefore will continue to benefit from skilled OT intervention to enhance overall performance with BADL, iADL and Reduce care partner burden.  Patient progressing toward long term goals..  Continue plan of care.  OT Short Term Goals Week 1:  OT Short Term Goal 1 (Week 1): Pt will be consistantly oriented x3 with external aids with supervision  OT Short Term Goal 1 - Progress (Week 1): Met OT Short Term Goal 2 (Week 1): Pt will transfer to toilet with steadying A OT Short Term Goal 2 - Progress (Week 1): Met OT Short Term Goal 3 (Week 1): Pt don shirt with min A sitting EOB OT Short Term Goal 3 - Progress (Week 1): Met OT Short Term Goal 4 (Week 1): Pt will demonstrate sustained  attention to functional task with min A for 15 min  OT Short Term Goal 4 - Progress (Week 1): Progressing toward goal OT Short Term Goal 5 (Week 1): Pt will don pants with min A  OT Short Term Goal 5 - Progress (Week 1): Met Week 2:  OT Short Term Goal 1 (Week 2): Pt will demonstrate sustained attention to functional task with min A for 15 min OT Short Term Goal 2 (Week 2): Pt will activelyand consistently (5/7 sessions) engage/participate in OT sessions without arguing with therapist OT Short Term Goal 3 (Week 2): Pt will not remove his C-collar and CAM boot except when instructed by therapists or other staff OT Short Term Goal 4 (Week 2): Pt will not attempt to elope from unit during the next reporting period   S  Therapy Documentation Precautions:  Precautions Precautions: Fall, Cervical Required Braces or Orthoses: Cervical Brace, Other Brace/Splint Cervical Brace: Hard collar Other Brace/Splint: L CAM boot for comfort, RUE splint Restrictions Weight Bearing Restrictions: Yes RUE Weight Bearing: Non weight bearing LUE Weight Bearing: Weight bearing as tolerated RLE Weight Bearing: Weight bearing as tolerated LLE Weight Bearing: Weight bearing as tolerated Other Position/Activity Restrictions: in CAM boot  See Function Navigator for Current Functional Status.    Travis Mcguire [facility] 06/21/2016, 6:48 AM
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
## 2016-06-22 ENCOUNTER — Inpatient Hospital Stay (HOSPITAL_COMMUNITY): Payer: Self-pay

## 2016-06-22 ENCOUNTER — Inpatient Hospital Stay (HOSPITAL_COMMUNITY): Payer: Medicaid Other | Admitting: Physical Therapy

## 2016-06-22 ENCOUNTER — Inpatient Hospital Stay (HOSPITAL_COMMUNITY): Payer: Medicaid Other | Admitting: Speech Pathology

## 2016-06-22 MED ORDER — CITALOPRAM HYDROBROMIDE 10 MG PO TABS
10.0000 mg | ORAL_TABLET | Freq: Every day | ORAL | Status: DC
  Administered 2016-06-23 – 2016-06-28 (×6): 10 mg via ORAL
  Filled 2016-06-22 (×6): qty 1

## 2016-06-22 NOTE — Progress Notes (Signed)
Physical Therapy Session Note  Patient Details  Name: Travis MessingDenzel L XXX Coone MRN: [id number] Date of Birth: January 27, 1996  Today's Date: 06/22/2016 PT Individual Time: 1305-1400 PT Individual Time Calculation (min): 55 min   Short Term Goals: Week 2:  PT Short Term Goal 1 (Week 2): Patient will sustain attention x 15 min with max multimodal cues.  PT Short Term Goal 2 (Week 2): Patient will wear braces (cervical collar, arm splint, CAM boot) 50% of time with max verbal cues.  PT Short Term Goal 3 (Week 2): Patient will identify 2 physical and 2 cognitive deficits with max multimodal cues.   Skilled Therapeutic Interventions/Progress Updates:    Patient sitting EOB upon arrival with RN present. Patient agreeable to donning cervical collar, RUE splint, and L CAM boot. Patient consumed 25% of lunch meal with mod verbal cues for attention to task. Patient required total A for intellectual awareness of cognitive deficits but was able to state physical injuries Patient initially very polite and cooperative but becoming increasingly frustrated and perseverating on wanting to call the doctor and then perseverating on going back to work. Per staff, patient saw visitor of another patient who was familiar to patient who told him she had a job for him as soon as he was discharged from hospital. After approx 30 minutes, patient removed cervical collar and CAM boot and donned tennis shoes, stating that he needed to get out of the hospital, becoming increasingly verbose with escalating language.  Patient redirected multiple times but continued to perseverate on being able to leave "because I got shit I need to do" and needing to get back to work.  Patient left room and was redirected by staff from exits for approx 30 min while remaining on unit with patient attempting to locate exit. Eventually patient exited out fire alarm door but was agreeable to stop and sit at 3rd floor to rest due to increased LLE pain. Patient's  behavior plan followed by therapy and nursing staff. Patient left sitting on steps in 3rd floor stairwell with PT supervisor, RN and sitter with staff aware of patient location and patient eventually returned to room.    Therapy Documentation Precautions:  Precautions Precautions: Fall, Cervical Required Braces or Orthoses: Cervical Brace, Other Brace/Splint Cervical Brace: Hard collar Other Brace/Splint: L CAM boot for comfort, RUE splint Restrictions Weight Bearing Restrictions: Yes RUE Weight Bearing: Non weight bearing LUE Weight Bearing: Weight bearing as tolerated RLE Weight Bearing: Weight bearing as tolerated LLE Weight Bearing: Weight bearing as tolerated Other Position/Activity Restrictions: in CAM boot Pain: Pain Assessment Pain Assessment: Faces Faces Pain Scale: Hurts little more Pain Type: Acute pain Pain Location: Leg Pain Orientation: Left Pain Descriptors / Indicators: Aching Pain Onset: With Activity Pain Intervention(s): Other (Comment) (attempted to have patient don CAM boot/rest, pt refused)   See Function Navigator for Current Functional Status.   Therapy/Group: Individual Therapy  Kerney ElbeVarner, Zakiah Gauthreaux A 06/22/2016, 5:08 PM
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
## 2016-06-22 NOTE — Progress Notes (Signed)
Speech Language Pathology Daily Session Note  Patient Details  Name: Gustavus MessingDenzel L XXX Edelman MRN: [id number] Date of Birth: [birth year]  Today's Date: 06/22/2016 SLP Individual Time: [id number] SLP Individual Time Calculation (min): 57 min   Short Term Goals: Week 2: SLP Short Term Goal 1 (Week 2): Patient will demonstrate functional problem solving for basic and functional tasks with Mod A verbal cues. SLP Short Term Goal 2 (Week 2): Patient will self-monitor and correct errors during functional tasks with Mod A verbal and visual cues.  SLP Short Term Goal 3 (Week 2): Patient will demonstrate sustained attention to functional tasks for 30 minutes with Min A verbal cues for redirection.  SLP Short Term Goal 4 (Week 2): Patient will identify 2 physical and 2 cognitive impairments with Mod A verbal and question cues.   Skilled Therapeutic Interventions:Pt seen for skilled SLP treatment targeting cognitive goals. Gains noted this date. Pt receptive to therapist direction- acknowledged errors and was agreeable to reattempt for corrections with functional math work. Min A for basic reasoning. Min A for occasional redirection to maintain attention to task. Pt with appropriate language throughout session.     Function:  Eating Eating                 Cognition Comprehension Comprehension assist level: Understands basic 90% of the time/cues < 10% of the time  Expression   Expression assist level: Expresses basic needs/ideas: With no assist  Social Interaction Social Interaction assist level: Interacts appropriately 50 - 74% of the time - May be physically or verbally inappropriate.  Problem Solving Problem solving assist level: Solves basic 50 - 74% of the time/requires cueing 25 - 49% of the time  Memory Memory assist level: Recognizes or recalls 50 - 74% of the time/requires cueing 25 - 49% of the time    Pain Pain Assessment Pain Assessment: No/denies pain Pain Score:  Asleep  Therapy/Group: Individual Therapy  Rocky CraftsKara E Tanaysha Alkins MA, CCC-SLP 06/22/2016, 10:57 AM

## 2016-06-22 NOTE — Progress Notes (Signed)
Occupational Therapy Session Note  Patient Details  Name: Travis Mcguire MRN: [id number] Date of Birth: 1996/09/23  Today's Date: 06/22/2016 OT Missed Time: 36 Minutes Missed Time Reason: Patient fatigue    Short Term Goals: Week 2:  OT Short Term Goal 1 (Week 2): Pt will demonstrate sustained attention to functional task with min A for 15 min OT Short Term Goal 2 (Week 2): Pt will activelyand consistently (5/7 sessions) engage/participate in OT sessions without arguing with therapist OT Short Term Goal 3 (Week 2): Pt will not remove his C-collar and CAM boot except when instructed by therapists or other staff OT Short Term Goal 4 (Week 2): Pt will not attempt to elope from unit during the next reporting period  Skilled Therapeutic Interventions/Progress Updates:    Pt missed 36 mins skilled OT services secondary to fatigue; pt sleeping upon arrival and unable to arouse adequately to participate in therapy.  Pt awoke self and stated he wanted to eat breakfast.  Pt inquiring if he could get a remote for his TV.  Pt accepted explanation (not until MD said it was ok). Pt very lethargic during remaining time in session.  Pt asked if it was ok to put salt and pepper on his grits.  Pt applied 2 containers of salt and pepper in addition to butter (after asking if he could use butter). Pt wanted to speak to MD but accepted explanation that MD was not available at this time.  Pt stated he was going to lay back down after he eat his breakfast.  Pt remained seated EOB with sitter present. Focus on activity tolerance, functional amb in room, safety awareness, and active participation in therapy. Pt did not exhibit any agitated behaviors during this session.  Therapy Documentation Precautions:  Precautions Precautions: Fall, Cervical Required Braces or Orthoses: Cervical Brace, Other Brace/Splint Cervical Brace: Hard collar Other Brace/Splint: L CAM boot for comfort, RUE splint Restrictions Weight  Bearing Restrictions: Yes RUE Weight Bearing: Non weight bearing LUE Weight Bearing: Weight bearing as tolerated RLE Weight Bearing: Weight bearing as tolerated LLE Weight Bearing: Weight bearing as tolerated Other Position/Activity Restrictions: in CAM boot General: General OT Amount of Missed Time: 36 Minutes Pain: Pt denied pain ADL: ADL ADL Comments: see functional navigator Exercises:   Other Treatments:    See Function Navigator for Current Functional Status.   Therapy/Group: Individual Therapy  Travis Mcguire, Travis Mcguire 06/22/2016, 10:06 AM

## 2016-06-23 ENCOUNTER — Inpatient Hospital Stay (HOSPITAL_COMMUNITY): Payer: Self-pay | Admitting: Speech Pathology

## 2016-06-23 ENCOUNTER — Inpatient Hospital Stay (HOSPITAL_COMMUNITY): Payer: Self-pay | Admitting: Occupational Therapy

## 2016-06-23 ENCOUNTER — Inpatient Hospital Stay (HOSPITAL_COMMUNITY): Payer: Medicaid Other | Admitting: Physical Therapy

## 2016-06-23 NOTE — Progress Notes (Signed)
06/23/16 0900 nursing Patient got  out of the unit wanting to go home . Patient  went straight to the elevator staff redirecting him  sitter, 2 NT's  and 2RN's but will not listen. Security was called to help him redirect to his room. Charisse MarchJeanna RN gave Haldol IM per order. Patient went back to sleep.

## 2016-06-23 NOTE — Progress Notes (Signed)
06/23/16 1200 nursing Patient was with sitter in room  and he became agitated again wanting to leave the unit. Patient got up and got out of the room staff continued to redirect patient but patient persisted to get out of the unit went to the elevator . Security was called at this time. Patient continued to go Ship brokereast elevator while staff and security continued to redirect him. Patient finally turned back towards the unit. Leanne NT who's off duty came to help redirect the patient. Patient cooperated with her. Patient was given Ativan IM per order for agitation.Patient went to bed and has been asleep after his shot.
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
## 2016-06-23 NOTE — Progress Notes (Signed)
Occupational Therapy Session Note  Patient Details  Name: Travis Mcguire MRN: [id number] Date of Birth: [birth year]  Today's Date: 06/23/2016 OT Individual Time: 1300-1330 OT Individual Time Calculation (min): 30 min     Short Term Goals: Week 2:  OT Short Term Goal 1 (Week 2): Pt will demonstrate sustained attention to functional task with min A for 15 min OT Short Term Goal 2 (Week 2): Pt will activelyand consistently (5/7 sessions) engage/participate in OT sessions without arguing with therapist OT Short Term Goal 3 (Week 2): Pt will not remove his C-collar and CAM boot except when instructed by therapists or other staff OT Short Term Goal 4 (Week 2): Pt will not attempt to elope from unit during the next reporting period  Skilled Therapeutic Interventions/Progress Updates:   Pt up and walking around room with mother, cousin and 4 yr sister present in room when arrived. Pt had just returned to the room from leaving the unit for a second time today. Pt had just reiceved meds ot help calm him and was getting increasing more tired. Pt with lots of questions of when can he go home and when is Dr. Riley KillSwartz coming back. Pt confused about time of day, time, situation and no recall of times eloped. Pt redirected to EOB to finish eating his lunch with environmental cues. Mother able to redirect his negative comments towards his 4 yr sister. Pt increasing more and more tired and was able to directing pt to lay down and rest. Shoes were doffed and placed in closet. [place] bed secured closed and left with sitter.   Therapy Documentation Precautions:  Precautions Precautions: Fall, Cervical Required Braces or Orthoses: Cervical Brace, Other Brace/Splint Cervical Brace: Hard collar Other Brace/Splint: L CAM boot for comfort, RUE splint Restrictions Weight Bearing Restrictions: Yes RUE Weight Bearing: Non weight bearing LUE Weight Bearing: Weight bearing as tolerated RLE Weight Bearing: Weight  bearing as tolerated LLE Weight Bearing: Weight bearing as tolerated Other Position/Activity Restrictions: in CAM boot    Pain:  no indications of pain  ADL: ADL ADL Comments: see functional navigator  See Function Navigator for Current Functional Status.   Therapy/Group: Individual Therapy  [name], [name] 06/23/2016, 3:06 PM

## 2016-06-23 NOTE — Progress Notes (Signed)
SLP Cancellation Note  Patient Details Name: Gustavus MessingDenzel L XXX Timme MRN: [id number] DOB: 1996-04-14   Cancelled treatment:        RN requested hold of SLP therapy as pt was sleeping during scheduled therapy time, but had been agitated and left the unit earlier this date.                                                                                                 Rocky CraftsKara E Niguel Moure MA, CCC-SLP 06/23/2016, 3:48 PM

## 2016-06-23 NOTE — Progress Notes (Signed)
Slept until 0045, incontinent of urine. SBA to bathroom. After eating supper at 0100, started perseverating on going home. Multi attempts to leave via fire exits, but able to redirect. No agitation or threatening staff. Ambulated  around unit for 45 mins., before agreeing to take scheduled HS meds and PRN ultram. Back to bed around 0200, rested quietly since. Alfredo MartinezMurray, Kregg Cihlar A

## 2016-06-23 NOTE — Progress Notes (Signed)
Physical Therapy Session Note  Patient Details  Name: ABSALOM ARO MRN: [id number] Date of Birth: 02/13/1996  Today's Date: 06/23/2016 PT Individual Time: 1110-1150 PT Individual Time Calculation (min): 40 min    Short Term Goals: Week 1:  PT Short Term Goal 1 (Week 1): Patient will maintain NWB RUE with mod multimodal cues with functional mobility tasks.  PT Short Term Goal 1 - Progress (Week 1): Not met PT Short Term Goal 2 (Week 1): Patient will ambulate 150 ft with close supervision.  PT Short Term Goal 2 - Progress (Week 1): Met PT Short Term Goal 3 (Week 1): Patient will perform transfers with consistent supervision.  PT Short Term Goal 3 - Progress (Week 1): Met PT Short Term Goal 4 (Week 1): Patient will sustain attention to functional tasks x 15 min with mod multimodal cues.  PT Short Term Goal 4 - Progress (Week 1): Not met PT Short Term Goal 5 (Week 1): Patient will identify 2 physical and 2 cognitive deficits with mod A.  PT Short Term Goal 5 - Progress (Week 1): Not met  Skilled Therapeutic Interventions/Progress Updates:    Pt was seen bedside in the am. Pt ambulating around room. Therapist engaged patient, pt stated he was getting ready to leave, packing his bags. Pt moving around room without cervical collar, cam walker or R UE splint on. Attempted to redirect patient, pt unwilling to participate with therapy at this time. Pt stating he needs to leave. Pt's preseverated on leaving, attempted to redirect with assistance on nursing and sitter.  Pt requested a sprite. Pt ambulated out into hallway with therapy and sitter to obtain drink. While therapist obtained sprite pt ambulated into day room with sitter. Pt sat at table in day room, pt was briefly distracted but continued to return to request to leave. Pt also continued to c/o pain all over. Pt educated on the importance of wearing the cervical collar as well as our splints to assist with healing and pain. Pt agreed to  take something for pain. Pt also agreed to return to room to put on cervical collar and other splints to assist with pain. Pt ambulated back to room with S. Pt sat up in recliner in room and allowed therapist to assist with placing the cervical collar, L cam walker, and R UE splint on. Pt finished chocolate pudding. At which time, pt transferred recliner to bed with S. Pt stated he was going to lay down until is lunch came. Pt unwilling to participate further with therapy. Pt removed R UE splint despite encouragement for therapist to leave. Pt did leave on L cam walker and cervical collar. Pt left resting quietly in bed with sitter at bedside.    Therapy Documentation Precautions:  Precautions Precautions: Fall, Cervical Required Braces or Orthoses: Cervical Brace, Other Brace/Splint Cervical Brace: Hard collar Other Brace/Splint: L CAM boot for comfort, RUE splint Restrictions Weight Bearing Restrictions: Yes RUE Weight Bearing: Non weight bearing LUE Weight Bearing: Weight bearing as tolerated RLE Weight Bearing: Weight bearing as tolerated LLE Weight Bearing: Weight bearing as tolerated Other Position/Activity Restrictions: in CAM boot General: PT Amount of Missed Time (min): 20 Minutes PT Missed Treatment Reason: Patient unwilling to participate;Patient fatigue;Increased agitation Vital Signs:   Pain: Pt c/o pain throughout all extremities.   See Function Navigator for Current Functional Status.   Therapy/Group: Individual Therapy  Dub Amis 06/23/2016, 12:18 PM

## 2016-06-23 NOTE — Plan of Care (Signed)
Problem: RH SAFETY Goal: RH STG ADHERE TO SAFETY PRECAUTIONS W/ASSISTANCE/DEVICE STG Adhere to Safety Precautions With max Assistance/Device.  Outcome: Not Progressing Total assist for safety

## 2016-06-24 ENCOUNTER — Inpatient Hospital Stay (HOSPITAL_COMMUNITY): Payer: Medicaid Other | Admitting: Physical Therapy

## 2016-06-24 ENCOUNTER — Inpatient Hospital Stay (HOSPITAL_COMMUNITY): Payer: Medicaid Other

## 2016-06-24 NOTE — Progress Notes (Signed)
06/24/16 1739 nursing Patient done eating and got agitated again wanting to call his mother. When patient cannot get what he wants so he went out of his room straight to the elevators. Security was called because it took a while for him to be redirected. Ativan IM given. Patient calmed down and went to bed.

## 2016-06-24 NOTE — Progress Notes (Signed)
Slept great last night. Wants to get good reports for Dr. Riley KillSwartz. No agitation. Martina SinnerMurray, Foye Damron A, RN

## 2016-06-24 NOTE — Progress Notes (Signed)
Occupational Therapy Session Note  Patient Details  Name: Travis Mcguire MRN: [id number] Date of Birth: [birth year]  Today's Date: 06/24/2016 OT Individual Time: 0900-0950 OT Individual Time Calculation (min): 50 min   Short Term Goals: Week 2:  OT Short Term Goal 1 (Week 2): Pt will demonstrate sustained attention to functional task with min A for 15 min OT Short Term Goal 2 (Week 2): Pt will activelyand consistently (5/7 sessions) engage/participate in OT sessions without arguing with therapist OT Short Term Goal 3 (Week 2): Pt will not remove his C-collar and CAM boot except when instructed by therapists or other staff OT Short Term Goal 4 (Week 2): Pt will not attempt to elope from unit during the next reporting period  Skilled Therapeutic Interventions/Progress Updates:   ADL-retraining at bed level with focus on sustained attention, improved awareness, reduction of agitation using therapeutic activities.   Pt received with sitter who reported pt completed bathing at 7:20 am due to incontinent episode.   Pt returned to bed and was eating his breakfast, slowly, while therapist engaged pt in review of his performance and safety precautions.  Pt completed self-feeding in 30 minutes while engaged in appropriate and polite conversation with OT with intermittent redirection required from discussions relating to discharge status, phone use, his girlfriend.  During redirection, pt stated he was willing to engage in card game but lost interest due to delay with acquisition of playing cards.   OT also demo'd use of Nintendo Wii console and tic-tac-toe bean bag toss as appropriate task in pt's room however pt refused to leave his bed due to increased somnolence.   Pt reclined in bed to sidelying while still attempting to watch game play between therapist and RN tech, but he feel asleep despite multiple attempts at provoking increased alertness.  Performance of BADL reviewed with RN tech providing  clarification on level of assist needed during activity.  Therapy Documentation Precautions:  Precautions Precautions: Fall, Cervical Required Braces or Orthoses: Cervical Brace, Other Brace/Splint Cervical Brace: Hard collar Other Brace/Splint: L CAM boot for comfort, RUE splint Restrictions Weight Bearing Restrictions: Yes (doesn't follow NWB precautions.) RUE Weight Bearing: Non weight bearing (doesn't follow precautions) LUE Weight Bearing: Weight bearing as tolerated RLE Weight Bearing: Weight bearing as tolerated LLE Weight Bearing: Weight bearing as tolerated Other Position/Activity Restrictions: in CAM boot   General: General OT Amount of Missed Time: 10 Minutes   Pain: No/denies pain     ADL: ADL ADL Comments: see functional navigator   See Function Navigator for Current Functional Status.   Therapy/Group: Individual Therapy  Travis Mcguire 06/24/2016, 12:23 PM

## 2016-06-24 NOTE — Progress Notes (Signed)
Physical Therapy Session Note  Patient Details  Name: Travis Mcguire MRN: [id number] Date of Birth: 1995-12-24  Today's Date: 06/24/2016 PT Individual Time: 0721-8288 PT Individual Time Calculation (min): 15 min    Short Term Goals: Week 1:  PT Short Term Goal 1 (Week 1): Patient will maintain NWB RUE with mod multimodal cues with functional mobility tasks.  PT Short Term Goal 1 - Progress (Week 1): Not met PT Short Term Goal 2 (Week 1): Patient will ambulate 150 ft with close supervision.  PT Short Term Goal 2 - Progress (Week 1): Met PT Short Term Goal 3 (Week 1): Patient will perform transfers with consistent supervision.  PT Short Term Goal 3 - Progress (Week 1): Met PT Short Term Goal 4 (Week 1): Patient will sustain attention to functional tasks x 15 min with mod multimodal cues.  PT Short Term Goal 4 - Progress (Week 1): Not met PT Short Term Goal 5 (Week 1): Patient will identify 2 physical and 2 cognitive deficits with mod A.  PT Short Term Goal 5 - Progress (Week 1): Not met  Skilled Therapeutic Interventions/Progress Updates:    Pt was seen bedside in the pm with sitter at bedside. Pt reluctant to participate with but agreed initially to play the wii. When therapist returned to room with wii, pt stated he didn't want to play wii today its boring. Pt stated he wanted to go outside, therapist attempted to redirect and encourage activities on the unit. Pt stated I think I will just stay in bed. Discussed with pt about prior days therapy. Pt became fixated on chocolate pudding with m&ms. Pt's sitter obtained pudding for patient. Pt sat up in bed with S. Pt continued to defer treatment with therapist. Pt left sitting up in bed eating pudding with sitter at bedside.    Therapy Documentation Precautions:  Precautions Precautions: Fall, Cervical Required Braces or Orthoses: Cervical Brace, Other Brace/Splint Cervical Brace: Hard collar Other Brace/Splint: L CAM boot for comfort,  RUE splint Restrictions Weight Bearing Restrictions: Yes (doesn't follow NWB precautions.) RUE Weight Bearing: Non weight bearing (doesn't follow precautions) LUE Weight Bearing: Weight bearing as tolerated RLE Weight Bearing: Weight bearing as tolerated LLE Weight Bearing: Weight bearing as tolerated Other Position/Activity Restrictions: in CAM boot General: PT Amount of Missed Time (min): 45 Minutes PT Missed Treatment Reason: Patient unwilling to participate;Patient fatigue Vital Signs:   Pain: No c/o pain.   See Function Navigator for Current Functional Status.   Therapy/Group: Individual Therapy  Dub Amis 06/24/2016, 1:59 PM
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
## 2016-06-24 NOTE — Plan of Care (Signed)
Problem: RH SAFETY Goal: RH STG ADHERE TO SAFETY PRECAUTIONS W/ASSISTANCE/DEVICE STG Adhere to Safety Precautions With max Assistance/Device.  Outcome: Not Progressing Patient can be agitated/ anxious ; unpredictable at times
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
## 2016-06-24 NOTE — Progress Notes (Signed)
06/24/16 1802 nursing Per sitter patient claims he feels claustrophobic and ask to unzip bed just a little bit but he got out and same thing wanting to go out and went straight to the elevator. He wants to go home. It took some time for him to be redirected again. CN came  with wheelchair and he agreed to go back in room. He then went to sleep.

## 2016-06-25 ENCOUNTER — Inpatient Hospital Stay (HOSPITAL_COMMUNITY): Payer: Medicaid Other | Admitting: Speech Pathology

## 2016-06-25 ENCOUNTER — Inpatient Hospital Stay (HOSPITAL_COMMUNITY): Payer: Self-pay

## 2016-06-25 ENCOUNTER — Inpatient Hospital Stay (HOSPITAL_COMMUNITY): Payer: Medicaid Other | Admitting: Physical Therapy

## 2016-06-25 NOTE — Progress Notes (Signed)
Speech Language Pathology Daily Session Note  Patient Details  Name: Travis Mcguire MRN: [id number] Date of Birth: [birth year]  Today's Date: 06/25/2016 SLP Individual Time: 0930-1015 SLP Individual Time Calculation (min): 45 min  Missed 15 minutes of skilled therapy due to decline to participate as a report of fatigue    Short Term Goals: Week 2: SLP Short Term Goal 1 (Week 2): Patient will demonstrate functional problem solving for basic and functional tasks with Mod A verbal cues. SLP Short Term Goal 2 (Week 2): Patient will self-monitor and correct errors during functional tasks with Mod A verbal and visual cues.  SLP Short Term Goal 3 (Week 2): Patient will demonstrate sustained attention to functional tasks for 30 minutes with Min A verbal cues for redirection.  SLP Short Term Goal 4 (Week 2): Patient will identify 2 physical and 2 cognitive impairments with Mod A verbal and question cues.   Skilled Therapeutic Interventions:   Skilled treatment session focused on addressing cognition goals. SLP facilitated session by providing Min verbal cues for re-orientation to date and name of hospital as well as Max assist question cues for intellectual awareness of current cognitive deficits.  Patient states that he can remember stuff now and therefore should be allowed to leave.  Patient intermittently verbally perseverated on wanting to go home and needing to find a piece of paper with importance phone numbers on it so he could use the phone.  However, patient was able to be redirected back to therapist selected tasks with Max assist verbal cues and sustained attention to tasks was about 1-2 minutes today.  Patient participated in and completed two therapist selected basic problem solving tasks with Mod cues to monitor and correct errors.  Session ended with patient reports of fatigue and wanting to return to bed.  Continue with current plan of care.    Function:  Cognition Comprehension  Comprehension assist level: Understands basic 75 - 89% of the time/ requires cueing 10 - 24% of the time  Expression   Expression assist level: Expresses basic needs/ideas: With no assist  Social Interaction Social Interaction assist level: Interacts appropriately 25 - 49% of time - Needs frequent redirection.  Problem Solving Problem solving assist level: Solves basic 25 - 49% of the time - needs direction more than half the time to initiate, plan or complete simple activities  Memory Memory assist level: Recognizes or recalls 25 - 49% of the time/requires cueing 50 - 75% of the time    Pain Pain Assessment Pain Assessment: No/denies pain  Therapy/Group: Individual Therapy  Charlane FerrettiMelissa Domingos Riggi, M.A., CCC-SLP [phone]  Travis Mcguire 06/25/2016, 1:13 PM
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
## 2016-06-25 NOTE — Progress Notes (Signed)
Occupational Therapy Session Note  Patient Details  Name: Travis Mcguire MRN: [id number] Date of Birth: Jan 23, 1996  Today's Date: 06/25/2016 OT Individual Time: [id number] OT Individual Time Calculation (min): 54 min     Short Term Goals: Week 2:  OT Short Term Goal 1 (Week 2): Pt will demonstrate sustained attention to functional task with min A for 15 min OT Short Term Goal 2 (Week 2): Pt will activelyand consistently (5/7 sessions) engage/participate in OT sessions without arguing with therapist OT Short Term Goal 3 (Week 2): Pt will not remove his C-collar and CAM boot except when instructed by therapists or other staff OT Short Term Goal 4 (Week 2): Pt will not attempt to elope from unit during the next reporting period  Skilled Therapeutic Interventions/Progress Updates:    Pt seated EOB with sitter present.  Pt continually verbalized during session that he was going to escape.  Pt exited room X 1 but was easily redirected back to room.  Pt required max verbal cues and assistance to don CAM boot and C-collar.  Pt declined playing game on Wii but agreeable to playing card games.  Pt instructed therapist on rules/objective of [place] and engaged in game X 1. Pt continually requested use of phone and to watch TV but was easily redirected. Pt required max verbal cues to recall conversation with MD earlier regarding promise to get through 24 hours without escape attempts. Pt stated he was getting tired after playing card games and requested to lay down.  Pt returned to enclosure bed and remained in secured bed with sitter present.  Focus on cognitive remediation and safety awareness.  Therapy Documentation Precautions:  Precautions Precautions: Fall, Cervical Required Braces or Orthoses: Cervical Brace, Other Brace/Splint Cervical Brace: Hard collar Other Brace/Splint: L CAM boot for comfort, RUE splint Restrictions Weight Bearing Restrictions: Yes (doesn't follow NWB precautions.) RUE  Weight Bearing: Non weight bearing LUE Weight Bearing: Weight bearing as tolerated RLE Weight Bearing: Weight bearing as tolerated LLE Weight Bearing: Weight bearing as tolerated Other Position/Activity Restrictions: in CAM boot   Pain:  Pt denies pain ADL: ADL ADL Comments: see functional navigator  See Function Navigator for Current Functional Status.   Therapy/Group: Individual Therapy  [name], Daksha Koone Chappell 06/25/2016, 9:25 AM

## 2016-06-26 ENCOUNTER — Inpatient Hospital Stay (HOSPITAL_COMMUNITY): Payer: Self-pay

## 2016-06-26 ENCOUNTER — Inpatient Hospital Stay (HOSPITAL_COMMUNITY): Payer: Self-pay | Admitting: Physical Therapy

## 2016-06-26 ENCOUNTER — Inpatient Hospital Stay (HOSPITAL_COMMUNITY): Payer: Medicaid Other | Admitting: Speech Pathology

## 2016-06-26 NOTE — Progress Notes (Signed)
Physical Therapy Note  Patient Details  Name: Travis Mcguire MRN: [id number] Date of Birth: [birth year] Today's Date: 06/26/2016    Time: 1300-1340 40 minutes  1:1 No c/o pain, pt c/o fatigue but able to participate. Pt performed sorting activity with signs with min cuing for memory.  Pt engaged in conversation about d/c planning with little recall of previous conversations on the topic. Pt then fatigued and states he needs to "take a nap", unwilling to participate further despite being given a variety of choices of activity.  Pt missed 20 minutes skilled PT.   [name] 06/26/2016, 1:56 PM

## 2016-06-26 NOTE — Progress Notes (Signed)
Speech Language Pathology Daily Session Note  Patient Details  Name: Travis MessingDenzel L XXX Kopko MRN: [id number] Date of Birth: October 13, 1995  Today's Date: 06/26/2016 SLP Individual Time: 0930-1015 SLP Individual Time Calculation (min): 45 min   Short Term Goals: Week 2: SLP Short Term Goal 1 (Week 2): Patient will demonstrate functional problem solving for basic and functional tasks with Mod A verbal cues. SLP Short Term Goal 2 (Week 2): Patient will self-monitor and correct errors during functional tasks with Mod A verbal and visual cues.  SLP Short Term Goal 3 (Week 2): Patient will demonstrate sustained attention to functional tasks for 30 minutes with Min A verbal cues for redirection.  SLP Short Term Goal 4 (Week 2): Patient will identify 2 physical and 2 cognitive impairments with Mod A verbal and question cues.   Skilled Therapeutic Interventions: Skilled treatment session focused on cognitive goals. SLP facilitated session by providing Mod-Max A verbal and question cues for recall and problem solving during a mildly complex, novel card task. Patient demonstrated sustained attention to task for 30 minutes with Mod A verbal cues for redirection. Patient missed remaining 15 minutes due to fatigue. Patient left supine in bed with sitter present. Continue with current plan of care.   Function:   Cognition Comprehension Comprehension assist level: Understands basic 75 - 89% of the time/ requires cueing 10 - 24% of the time  Expression   Expression assist level: Expresses basic needs/ideas: With no assist  Social Interaction Social Interaction assist level: Interacts appropriately 25 - 49% of time - Needs frequent redirection.  Problem Solving Problem solving assist level: Solves basic 25 - 49% of the time - needs direction more than half the time to initiate, plan or complete simple activities  Memory Memory assist level: Recognizes or recalls 25 - 49% of the time/requires cueing 50 - 75% of the  time    Pain No/Denies Pain  Therapy/Group: Individual Therapy  Neyla Gauntt 06/26/2016, 5:16 PM

## 2016-06-26 NOTE — Progress Notes (Signed)
Occupational Therapy Session Note  Patient Details  Name: Travis Mcguire MRN: [id number] Date of Birth: [birth year]  Today's Date: 06/26/2016 OT Individual Time: 0800-0900 OT Individual Time Calculation (min): 60 min     Short Term Goals: Week 2:  OT Short Term Goal 1 (Week 2): Pt will demonstrate sustained attention to functional task with min A for 15 min OT Short Term Goal 2 (Week 2): Pt will activelyand consistently (5/7 sessions) engage/participate in OT sessions without arguing with therapist OT Short Term Goal 3 (Week 2): Pt will not remove his C-collar and CAM boot except when instructed by therapists or other staff OT Short Term Goal 4 (Week 2): Pt will not attempt to elope from unit during the next reporting period  Skilled Therapeutic Interventions/Progress Updates:    Pt eating breakfast upon arrival.  Pt initially declined bathing this morning but agreed after discussion regarding pt's goals and goals of therapy.  Pt continues to require max verbal cues to don c-collar and L CAM boot.  Pt engaged in BADL retraining including bathing at shower level and dressing with sit<>stand from seat in bathroom.  Pt initially attempted to don pants while standing up but sat in chair after requested.  Pt stated he was tired and requested to lay back into bed.  Focus on safety awareness, activity tolerance, standing balance, attention to task, and active cooperative participation to increase independence with BADLs.  Therapy Documentation Precautions:  Precautions Precautions: Fall, Cervical Required Braces or Orthoses: Cervical Brace, Other Brace/Splint Cervical Brace: Hard collar Other Brace/Splint: L CAM boot for comfort, RUE splint Restrictions Weight Bearing Restrictions: Yes RUE Weight Bearing: Non weight bearing LUE Weight Bearing: Weight bearing as tolerated RLE Weight Bearing: Weight bearing as tolerated LLE Weight Bearing: Weight bearing as tolerated Other Position/Activity  Restrictions: in CAM boot Pain:  Pt denies pain ADL: ADL ADL Comments: see functional navigator  See Function Navigator for Current Functional Status.   Therapy/Group: Individual Therapy  [name], Harlym Gehling Chappell 06/26/2016, 9:47 AM
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
## 2016-06-27 ENCOUNTER — Inpatient Hospital Stay (HOSPITAL_COMMUNITY): Payer: Self-pay | Admitting: Physical Therapy

## 2016-06-27 ENCOUNTER — Inpatient Hospital Stay (HOSPITAL_COMMUNITY): Payer: Medicaid Other | Admitting: Speech Pathology

## 2016-06-27 ENCOUNTER — Inpatient Hospital Stay (HOSPITAL_COMMUNITY): Payer: Self-pay
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
## 2016-06-27 NOTE — Progress Notes (Signed)
Speech Language Pathology Daily Session Note  Patient Details  Name: Travis MessingDenzel L XXX Batterton MRN: [id number] Date of Birth: 03/08/1996  Today's Date: 06/27/2016 SLP Individual Time: 1435-1505 SLP Individual Time Calculation (min): 30 min   Short Term Goals: Week 2: SLP Short Term Goal 1 (Week 2): Patient will demonstrate functional problem solving for basic and functional tasks with Mod A verbal cues. SLP Short Term Goal 2 (Week 2): Patient will self-monitor and correct errors during functional tasks with Mod A verbal and visual cues.  SLP Short Term Goal 3 (Week 2): Patient will demonstrate sustained attention to functional tasks for 30 minutes with Min A verbal cues for redirection.  SLP Short Term Goal 4 (Week 2): Patient will identify 2 physical and 2 cognitive impairments with Mod A verbal and question cues.   Skilled Therapeutic Interventions:Pt seen for skilled SLP treatment targeting cognitive goals. Continued gains noted this date. Pt receptive to therapist direction- acknowledged errors and was agreeable to reattempt for corrections with card game. Tended to relay on nurse tech for recall, however with verbal encouragement and min cues, pt able to recall aproximately 50% of details from previous session. Min A for basic reasoning. Min A for occasional redirection to maintain attention to task. Pt with appropriate language throughout session.     Function:  Eating Eating                 Cognition Comprehension Comprehension assist level: Understands basic 75 - 89% of the time/ requires cueing 10 - 24% of the time  Expression   Expression assist level: Expresses basic needs/ideas: With no assist  Social Interaction Social Interaction assist level: Interacts appropriately 50 - 74% of the time - May be physically or verbally inappropriate.  Problem Solving Problem solving assist level: Solves basic 50 - 74% of the time/requires cueing 25 - 49% of the time  Memory Memory assist  level: Recognizes or recalls 50 - 74% of the time/requires cueing 25 - 49% of the time    Pain Pain Assessment Pain Assessment: No/denies pain Pain Score: 2   Therapy/Group: Individual Therapy  Rocky CraftsKara E Brad Mcgaughy MA, CCC-SLP 06/27/2016, 3:17 PM

## 2016-06-27 NOTE — Progress Notes (Signed)
Occupational Therapy Session Note  Patient Details  Name: Travis Mcguire MRN: [id number] Date of Birth: 03-18-1996  Today's Date: 06/27/2016 OT Individual Time: 0800-0900 OT Individual Time Calculation (min): 60 min     Short Term Goals: Week 2:  OT Short Term Goal 1 (Week 2): Pt will demonstrate sustained attention to functional task with min A for 15 min OT Short Term Goal 2 (Week 2): Pt will activelyand consistently (5/7 sessions) engage/participate in OT sessions without arguing with therapist OT Short Term Goal 3 (Week 2): Pt will not remove his C-collar and CAM boot except when instructed by therapists or other staff OT Short Term Goal 4 (Week 2): Pt will not attempt to elope from unit during the next reporting period  Skilled Therapeutic Interventions/Progress Updates:    Pt initially asleep upon arrival but easily aroused.  Pt stated he was "upset" because MD stated he would be in hospital approx 2 more weeks.  Pt stated he wanted to go home now but made not attempts to leave his room or the unit. Pt ate his breakfast and then agreed to take a walk into the Dayroom.  Pt instructed to don his C-collar and L CAM boot.  Reeducated pt on importance of wearing these devices.  Pt verbalized understanding but stated he didn't like wearing the C-collar and he didn't think it was doing any good.  Pt donned his RUE splint without verbal cues.  Pt amb to Dayroom before stating he was tired and needed to return to his room.  Focus on activity tolerance, active participation, safety awareness, functional amb, orientation, and cognitive remediation. Therapy Documentation Precautions:  Precautions Precautions: Fall, Cervical Required Braces or Orthoses: Cervical Brace, Other Brace/Splint Cervical Brace: Hard collar Other Brace/Splint: L CAM boot for comfort, RUE splint Restrictions Weight Bearing Restrictions: Yes RUE Weight Bearing: Non weight bearing LUE Weight Bearing: Weight bearing as  tolerated RLE Weight Bearing: Weight bearing as tolerated LLE Weight Bearing: Weight bearing as tolerated Other Position/Activity Restrictions: in CAM boot General:   Vital Signs:   Pain:   ADL: ADL ADL Comments: see functional navigator Exercises:   Other Treatments:    See Function Navigator for Current Functional Status.   Therapy/Group: Individual Therapy  [name], Travis Mcguire 06/27/2016, 11:30 AM

## 2016-06-27 NOTE — Progress Notes (Addendum)
Physical Therapy Session Note  Patient Details  Name: Travis Mcguire MRN: [id number] Date of Birth: 04/05/1996  Today's Date: 06/27/2016 PT Individual Time: 1307-1400 PT Individual Time Calculation (min): 53 min    Short Term Goals: Week 2:  PT Short Term Goal 1 (Week 2): Patient will sustain attention x 15 min with max multimodal cues.  PT Short Term Goal 2 (Week 2): Patient will wear braces (cervical collar, arm splint, CAM boot) 50% of time with max verbal cues.  PT Short Term Goal 3 (Week 2): Patient will identify 2 physical and 2 cognitive deficits with max multimodal cues.   Skilled Therapeutic Interventions/Progress Updates:    Pt received in bed & agreeable to PT. Pt ambulated transferred OOB and donned cervical collar with assistance from NT but only required supervision for donning L cam boot. Pt ambulated room<>dayroom throughout session with supervision overall with PT. Instructed pt on finding post-its on wall, alternating between a letter & number; pt initially required max cuing to attend to task but eventually was able to complete task without cuing. Pt instructed therapist in playing card game (Tonk); pt able to follow instructions with correct recall 50% of the time when therapist instructed pt to play card game in different manner (all one suite, alternating colors). On ~3 occasions pt required max cuing for appropriate behavior & language use during session. Pt easily redirected this session. Pt left in day room in handoff to SLP at end of session.   Throughout session pt required encouragement & instruction for donning c-collar as he would attempt to take it off while eating. Pt agreeable to donning c-collar with moderate cuing.   Therapy Documentation Precautions:  Precautions Precautions: Fall, Cervical Required Braces or Orthoses: Cervical Brace, Other Brace/Splint Cervical Brace: Hard collar Other Brace/Splint: L CAM boot for comfort, RUE  splint Restrictions Weight Bearing Restrictions: Yes RUE Weight Bearing: Non weight bearing LUE Weight Bearing: Weight bearing as tolerated RLE Weight Bearing: Weight bearing as tolerated LLE Weight Bearing: Weight bearing as tolerated Other Position/Activity Restrictions: in CAM boot   See Function Navigator for Current Functional Status.   Therapy/Group: Individual Therapy  Sandi MariscalVictoria M Bela Bonaparte 06/27/2016, 4:57 PM

## 2016-06-27 NOTE — Progress Notes (Signed)
Speech Language Pathology Daily Session Note  Patient Details  Name: Travis Mcguire MRN: [id number] Date of Birth: 01/31/1996  Today's Date: 06/27/2016 SLP Individual Time: 1400-1430 SLP Individual Time Calculation (min): 30 min   Short Term Goals: Week 2: SLP Short Term Goal 1 (Week 2): Patient will demonstrate functional problem solving for basic and functional tasks with Mod A verbal cues. SLP Short Term Goal 2 (Week 2): Patient will self-monitor and correct errors during functional tasks with Mod A verbal and visual cues.  SLP Short Term Goal 3 (Week 2): Patient will demonstrate sustained attention to functional tasks for 30 minutes with Min A verbal cues for redirection.  SLP Short Term Goal 4 (Week 2): Patient will identify 2 physical and 2 cognitive impairments with Mod A verbal and question cues.   Skilled Therapeutic Interventions: Skilled treatment session focused on cognitive goals. SLP facilitated session by providing supervision verbal cues for problem solving during a basic money management task. Patient also required total A to utilize external memory aid for recall of rules to a previously learned task but was Mod A for actual recall of rules with use of aid. Overall, patient's interaction were appropriate throughout the session. Patient left sitting upright in recliner with all needs within reach and sitter present. Continue with current plan of care.   Function:   Cognition Comprehension Comprehension assist level: Understands basic 75 - 89% of the time/ requires cueing 10 - 24% of the time  Expression   Expression assist level: Expresses basic needs/ideas: With no assist  Social Interaction Social Interaction assist level: Interacts appropriately 50 - 74% of the time - May be physically or verbally inappropriate.  Problem Solving Problem solving assist level: Solves basic 50 - 74% of the time/requires cueing 25 - 49% of the time  Memory Memory assist level: Recognizes  or recalls 50 - 74% of the time/requires cueing 25 - 49% of the time    Pain No/Denies Pain  Therapy/Group: Individual Therapy  Berlie Hatchel 06/27/2016, 3:07 PM

## 2016-06-28 ENCOUNTER — Inpatient Hospital Stay (HOSPITAL_COMMUNITY): Payer: Self-pay | Admitting: Physical Therapy

## 2016-06-28 ENCOUNTER — Inpatient Hospital Stay (HOSPITAL_COMMUNITY): Payer: Medicaid Other | Admitting: Speech Pathology

## 2016-06-28 ENCOUNTER — Inpatient Hospital Stay (HOSPITAL_COMMUNITY): Payer: Self-pay

## 2016-06-28 NOTE — Progress Notes (Signed)
Occupational Therapy Weekly Progress Note  Patient Details  Name: Travis Mcguire MRN: [id number] Date of Birth: 03-20-1996  Beginning of progress report period: June 21, 2016 End of progress report period: June 28, 2016  Patient has met 1 of 4 short term goals.  Pt is making slow but steady progress.  Pt continues to perform BADLs at supervision level but requires mod/max A to consistently initiate self care tasks.  Pt attempted to elope X 4 during the past reporting period but has not attempted elopement for the past four days.  Pt requires max verbal cues to recall daily goals of active participation and no attempts to leave the hospital.  Pt continues to perseverate on making phone calls.  Pt exhibits behaviors consistent with Rancho Level VI.  Patient continues to demonstrate the following deficits: activity tolerance, ability to maintain medical precautions, decr safety and intellectual awareness, decr attention, functional problem solving and continues to be an elopement risk and therefore will continue to benefit from skilled OT intervention to enhance overall performance with BADL, iADL and Reduce care partner burden.  Patient progressing toward long term goals..  Continue plan of care.  OT Short Term Goals Week 2:  OT Short Term Goal 1 (Week 2): Pt will demonstrate sustained attention to functional task with min A for 15 min OT Short Term Goal 1 - Progress (Week 2): Met OT Short Term Goal 2 (Week 2): Pt will activelyand consistently (5/7 sessions) engage/participate in OT sessions without arguing with therapist OT Short Term Goal 2 - Progress (Week 2): Progressing toward goal OT Short Term Goal 3 (Week 2): Pt will not remove his C-collar and CAM boot except when instructed by therapists or other staff OT Short Term Goal 3 - Progress (Week 2): Progressing toward goal OT Short Term Goal 4 (Week 2): Pt will not attempt to elope from unit during the next reporting period OT  Short Term Goal 4 - Progress (Week 2): Progressing toward goal Week 3:  OT Short Term Goal 1 (Week 3): Pt will consistently (5/7 sessions) engage/participate in therapy without arguing with therapist OT Short Term Goal 2 (Week 3): Pt will not remove C-collar or CAM boot unless instructed by therapists or other staff OT Short Term Goal 3 (Week 3): Pt will not attempt to elope from the unit during the next reporting period.      Therapy Documentation Precautions:  Precautions Precautions: Fall, Cervical Required Braces or Orthoses: Cervical Brace, Other Brace/Splint Cervical Brace: Hard collar Other Brace/Splint: L CAM boot for comfort, RUE splint Restrictions Weight Bearing Restrictions: Yes RUE Weight Bearing: Non weight bearing LUE Weight Bearing: Weight bearing as tolerated RLE Weight Bearing: Weight bearing as tolerated LLE Weight Bearing: Weight bearing as tolerated Other Position/Activity Restrictions: in CAM boot    See Function Navigator for Current Functional Status.     [name] [facility] 06/28/2016, 12:21 PM

## 2016-06-28 NOTE — Progress Notes (Signed)
Speech Language Pathology Weekly Progress and Session Note  Patient Details  Name: Travis Mcguire MRN: [id number] Date of Birth: 1996/01/07  Beginning of progress report period: June 21, 2016 End of progress report period: June 28, 2016  Today's Date: 06/28/2016 SLP Individual Time: 8756-4332 SLP Individual Time Calculation (min): 40 min   Short Term Goals: Week 2: SLP Short Term Goal 1 (Week 2): Patient will demonstrate functional problem solving for basic and functional tasks with Mod A verbal cues. SLP Short Term Goal 1 - Progress (Week 2): Met SLP Short Term Goal 2 (Week 2): Patient will self-monitor and correct errors during functional tasks with Mod A verbal and visual cues.  SLP Short Term Goal 2 - Progress (Week 2): Met SLP Short Term Goal 3 (Week 2): Patient will demonstrate sustained attention to functional tasks for 30 minutes with Min A verbal cues for redirection.  SLP Short Term Goal 3 - Progress (Week 2): Not met SLP Short Term Goal 4 (Week 2): Patient will identify 2 physical and 2 cognitive impairments with Mod A verbal and question cues.  SLP Short Term Goal 4 - Progress (Week 2): Met    New Short Term Goals: Week 3: SLP Short Term Goal 1 (Week 3): Patient will identify 2 physical and 2 cognitive impairments with Min A verbal and question cues.  SLP Short Term Goal 2 (Week 3): Patient will demonstrate sustained attention to functional tasks for 30 minutes with Min A verbal cues for redirection.  SLP Short Term Goal 3 (Week 3): Patient will self-monitor and correct errors during functional tasks with Min A verbal and visual cues.  SLP Short Term Goal 4 (Week 3): Patient will demonstrate functional problem solving for basic and functional tasks with Min A verbal cues. SLP Short Term Goal 5 (Week 3): Patient will request to get back into bed during a 30 minute session no more than 3 times with Mod A question and verbal cues.  SLP Short Term Goal 6 (Week 3):  Patient will wear his c-collar for 30 minutes with Min A verbal cues.   Weekly Progress Updates: Patient has made functional gains and has met 3 of 4 STG's this reporting period due to improved cognitive function. Currently, patient demonstrates behaviors consistent with a Rancho Level VI and requires overall Mod A multimodal cues to complete functional and familiar tasks safely in regards to attention, problem solving and recall. Patient continues to require Max-Total A for awareness of deficits and adhering to precautions. Overall, patient demonstrates improved behavior with appropriate language but continues to demonstrate intermittent agitation and requires constant encouragement to wear braces and to stay up in a chair and out of bed. Patient and family education ongoing. Patient would benefit from continued skilled SLP intervention to maximize his cognitive function and overall functional independence prior to discharge.     Intensity: Minumum of 1-2 x/day, 30 to 90 minutes Frequency: 3 to 5 out of 7 days Duration/Length of Stay: TBD, ~1.5 weeks  Treatment/Interventions: Cognitive remediation/compensation;Cueing hierarchy;Functional tasks;Patient/family education;Dysphagia/aspiration precaution training;Internal/external aids;Environmental controls;Therapeutic Activities   Daily Session  Skilled Therapeutic Interventions: Skilled treatment session focused on cognitive goals. SLP facilitated session by providing Mod-Max A question cues for intellectual awareness of current cognitive and physical deficits and total A for their impact on his current function. Patient required Max A verbal cues to donn C-Collar and leave it on throughout session and was perseverative on getting back into bed. Patient recalled events from previous treatment session  with Min A question cues. Patient left supine in bed with all needs within reach and sitter present. Continue with current plan of care.      Function:    Cognition Comprehension Comprehension assist level: Understands basic 75 - 89% of the time/ requires cueing 10 - 24% of the time  Expression   Expression assist level: Expresses basic needs/ideas: With no assist  Social Interaction Social Interaction assist level: Interacts appropriately 50 - 74% of the time - May be physically or verbally inappropriate.  Problem Solving Problem solving assist level: Solves basic 50 - 74% of the time/requires cueing 25 - 49% of the time  Memory Memory assist level: Recognizes or recalls 50 - 74% of the time/requires cueing 25 - 49% of the time   Pain No/Denies Pain   Therapy/Group: Individual Therapy  Lorielle Boehning 06/28/2016, 4:54 PM

## 2016-06-28 NOTE — Progress Notes (Signed)
Physical Therapy Note  Patient Details  Name: Travis Mcguire MRN: [id number] Date of Birth: 10/05/1996 Today's Date: 06/28/2016    Time: 1300-1410 70 minutes  1:1 No c/o pain during session. Pt agreeable to PT session. Gait with boot on with supervision. Standing balance for bean bag toss game with supervision, pt limited to standing <5 minutes due to fatigue and Rt LE pain.  Pt able to add score for bean bag game with min cuing.  Pt able to engage in card games with therapist with min cues for attention to task, good turn taking and organization.  Pt in good spirits this session.   Travis Mcguire 06/28/2016, 2:11 PM
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
## 2016-06-28 NOTE — Progress Notes (Signed)
Occupational Therapy Session Note  Patient Details  Name: Travis Mcguire MRN: [id number] Date of Birth: Jul 17, 1996  Today's Date: 06/28/2016 OT Individual Time: 0800-0900 OT Individual Time Calculation (min): 60 min     Short Term Goals: Week 2:  OT Short Term Goal 1 (Week 2): Pt will demonstrate sustained attention to functional task with min A for 15 min OT Short Term Goal 2 (Week 2): Pt will activelyand consistently (5/7 sessions) engage/participate in OT sessions without arguing with therapist OT Short Term Goal 3 (Week 2): Pt will not remove his C-collar and CAM boot except when instructed by therapists or other staff OT Short Term Goal 4 (Week 2): Pt will not attempt to elope from unit during the next reporting period  Skilled Therapeutic Interventions/Progress Updates:    Pt engaged in BADL retraining including bathing at shower level and dressing with sit<>stand from seat.  Pt also engaged in simple home mgmt tasks including stripping his bed and placing new linens on.  Reviewed goals of therapy to facilitate discharge home in 11 days.  Pt continues to required max verbal cues to don C-collar and L CAM boot.  Pt assisted with taking dirty linens to laundry shoot.  Pt remained in room with sitter present.  Focus on activity tolerance, cognitive remediation, and safety awareness. Therapy Documentation Precautions:  Precautions Precautions: Fall, Cervical Required Braces or Orthoses: Cervical Brace, Other Brace/Splint Cervical Brace: Hard collar Other Brace/Splint: L CAM boot for comfort, RUE splint Restrictions Weight Bearing Restrictions: Yes RUE Weight Bearing: Non weight bearing LUE Weight Bearing: Weight bearing as tolerated RLE Weight Bearing: Weight bearing as tolerated LLE Weight Bearing: Weight bearing as tolerated Other Position/Activity Restrictions: in CAM boot   ADL: ADL ADL Comments: see functional navigator Exercises:   Other Treatments:    See Function  Navigator for Current Functional Status.   Therapy/Group: Individual Therapy  [name], [name] 06/28/2016, 12:13 PM
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
## 2016-06-29 ENCOUNTER — Inpatient Hospital Stay (HOSPITAL_COMMUNITY): Payer: Self-pay

## 2016-06-29 ENCOUNTER — Inpatient Hospital Stay (HOSPITAL_COMMUNITY): Payer: Medicaid Other | Admitting: Speech Pathology

## 2016-06-29 ENCOUNTER — Inpatient Hospital Stay (HOSPITAL_COMMUNITY): Payer: Self-pay | Admitting: Physical Therapy

## 2016-06-29 LAB — COMPREHENSIVE METABOLIC PANEL
ALK PHOS: 116 U/L (ref 38–126)
ALT: 45 U/L (ref 17–63)
AST: 45 U/L — AB (ref 15–41)
Albumin: 3.5 g/dL (ref 3.5–5.0)
Anion gap: 9 (ref 5–15)
BUN: 16 mg/dL (ref 6–20)
CALCIUM: 9.1 mg/dL (ref 8.9–10.3)
CHLORIDE: 104 mmol/L (ref 101–111)
CO2: 26 mmol/L (ref 22–32)
CREATININE: 1.05 mg/dL (ref 0.61–1.24)
GFR calc Af Amer: >60 mL/min (ref >60)
Glucose, Bld: 107 mg/dL — ABNORMAL HIGH (ref 65–99)
Potassium: 3.9 mmol/L (ref 3.5–5.1)
Sodium: 139 mmol/L (ref 135–145)
Total Bilirubin: 0.4 mg/dL (ref 0.3–1.2)
Total Protein: 6.7 g/dL (ref 6.5–8.1)

## 2016-06-29 MED ORDER — OXYCODONE HCL 5 MG PO TABS
5.0000 mg | ORAL_TABLET | Freq: Four times a day (QID) | ORAL | Status: DC | PRN
  Administered 2016-06-29 – 2016-07-13 (×15): 5 mg via ORAL
  Filled 2016-06-29 (×22): qty 1

## 2016-06-29 MED ORDER — QUETIAPINE FUMARATE 50 MG PO TABS
150.0000 mg | ORAL_TABLET | Freq: Every day | ORAL | Status: DC
  Administered 2016-06-29 – 2016-07-11 (×11): 150 mg
  Filled 2016-06-29 (×13): qty 1

## 2016-06-29 MED ORDER — CITALOPRAM HYDROBROMIDE 20 MG PO TABS
20.0000 mg | ORAL_TABLET | Freq: Every day | ORAL | Status: DC
  Administered 2016-06-29 – 2016-07-02 (×4): 20 mg via ORAL
  Filled 2016-06-29 (×4): qty 1

## 2016-06-29 NOTE — Progress Notes (Addendum)
Physical Therapy Note  Patient Details  Name: Gustavus MessingDenzel L XXX Matzke MRN: [id number] Date of Birth: 05/12/1996 Today's Date: 06/29/2016    Time: 730-825 55 minutes  1:1 No c/o pain. Pt donned cervical collar with min A for orientation, supervision with min cuing to don cam boot.  Pt able to stand >10 minutes to play basketball and to make coffee. Pt still with memory deficits and difficulty problem solving, frequently needing reminders to add sugar or cream to coffee if it is too bitter.  Pt perseverating on d/c day, continuously asking if he can go home earlier despite frequent reminders and explanation. Pt improving activity tolerance and compliance with therapy, still wears braces <50% of session.   Time 2: 1030 Missed 30 minutes  Pt had been agitated, given ativan and now resting comfortably in veil bed. RN asks PT not to disturb. Will see pt as appropriate.  Reshawn Ostlund 06/29/2016, 8:24 AM

## 2016-06-29 NOTE — Progress Notes (Signed)
Speech Language Pathology Daily Session Note  Patient Details  Name: Travis Mcguire MRN: [id number] Date of Birth: 1996/03/07  Today's Date: 06/29/2016 SLP Individual Time: 1415-1445 SLP Individual Time Calculation (min): 30 min   Short Term Goals: Week 3: SLP Short Term Goal 1 (Week 3): Patient will identify 2 physical and 2 cognitive impairments with Min A verbal and question cues.  SLP Short Term Goal 2 (Week 3): Patient will demonstrate sustained attention to functional tasks for 30 minutes with Min A verbal cues for redirection.  SLP Short Term Goal 3 (Week 3): Patient will self-monitor and correct errors during functional tasks with Min A verbal and visual cues.  SLP Short Term Goal 4 (Week 3): Patient will demonstrate functional problem solving for basic and functional tasks with Min A verbal cues. SLP Short Term Goal 5 (Week 3): Patient will request to get back into bed during a 30 minute session no more than 3 times with Mod A question and verbal cues.  SLP Short Term Goal 6 (Week 3): Patient will wear his c-collar for 30 minutes with Min A verbal cues.   Skilled Therapeutic Interventions: Skilled treatment session focused on cognitive goals. Upon arrival, patient was sitting upright in recliner while consuming his lunch meal. Patient demonstrated alternating attention between conversation and self-feeding for ~5 minutes with supervision verbal cues. Patient perseverative on "getting discharge papers" and frequently walking into the hallway talking to other staff members and requesting to speak to Dr. Riley KillSwartz. Patient redirected by RN, clinician and girlfriend multiple times. Clinician left 15 minutes early due to constant perseveration on discharge upon seeing staff members with girlfriend demonstrating an excellent job of deescalating patient. Patient left resting in enclosure bed (unzipped) with girlfriend present. Continue with current plan of care.   Function:  Eating Eating    Modified Consistency Diet: No Eating Assist Level: Swallowing techniques: self managed;More than reasonable amount of time   Eating Set Up Assist For: Opening containers       Cognition Comprehension Comprehension assist level: Understands basic 75 - 89% of the time/ requires cueing 10 - 24% of the time  Expression   Expression assist level: Expresses basic needs/ideas: With no assist  Social Interaction Social Interaction assist level: Interacts appropriately 50 - 74% of the time - May be physically or verbally inappropriate.  Problem Solving Problem solving assist level: Solves basic 50 - 74% of the time/requires cueing 25 - 49% of the time  Memory Memory assist level: Recognizes or recalls 50 - 74% of the time/requires cueing 25 - 49% of the time    Pain Reports pain in leg, patient premedicated   Therapy/Group: Individual Therapy  Walter Grima 06/29/2016, 3:07 PM
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
## 2016-06-29 NOTE — Progress Notes (Signed)
Physical Therapy Weekly Progress Note  Patient Details  Name: Travis Mcguire MRN: [id number] Date of Birth: 1996-06-23  Beginning of progress report period: June 21, 2016 End of progress report period: June 29, 2016  Patient has met 1 of 3 short term goals.  Pt still not wearing cervical collar and cam boot 50% of the time despite max cuing, pt still with decreased safety awareness, awareness of deficits, impaired memory and attention but is improving behavior and is more compliant with participation in therapy.  Patient continues to demonstrate the following deficits: decreased activity tolerance, impaired strength, memory, awareness and attention and therefore will continue to benefit from skilled PT intervention to enhance overall performance with activity tolerance, ability to compensate for deficits, attention, awareness and knowledge of precautions.  Patient progressing toward long term goals..  Continue plan of care.  PT Short Term Goals Week 2:  PT Short Term Goal 1 (Week 2): Patient will sustain attention x 15 min with max multimodal cues.  PT Short Term Goal 1 - Progress (Week 2): Met PT Short Term Goal 2 (Week 2): Patient will wear braces (cervical collar, arm splint, CAM boot) 50% of time with max verbal cues.  PT Short Term Goal 2 - Progress (Week 2): Progressing toward goal PT Short Term Goal 3 (Week 2): Patient will identify 2 physical and 2 cognitive deficits with max multimodal cues.  PT Short Term Goal 3 - Progress (Week 2): Met Week 3:  PT Short Term Goal 1 (Week 3): Pt will wear cervical collar and cam boot >50% of the time with min cuing PT Short Term Goal 2 (Week 3): Pt will tolerate standing for functional tasks x 10 minutes before fatigue   Skilled Therapeutic Interventions/Progress Updates: Ambulation/gait training;Balance/vestibular training;Cognitive remediation/compensation;Community reintegration;DME/adaptive equipment instruction;Discharge  planning;Disease management/prevention;Functional mobility training;Neuromuscular re-education;Pain management;Patient/family education;Psychosocial support;Stair training;Therapeutic Activities;Therapeutic Exercise;UE/LE Strength taining/ROM;UE/LE Coordination activities;Wheelchair propulsion/positioning     See Function Navigator for Current Functional Status.    Travis Mcguire 06/29/2016, 7:23 AM

## 2016-06-29 NOTE — Progress Notes (Addendum)
0900:  Pt. beginning to escalate, perseverating about LLE pain, going home.  Tramadol given for pain.  Pt wandering with staff present, to elevators, "Im leaving."  Ativan 2mg  given po.  Pt took all meds without incident. Dr. Riley KillSwartz saw pt., remains uncooperative, loud tone. Eventually escorted back to room.  Ice to LLE.  Resting quietly at present. 1015.Marland Kitchen.Marland Kitchen.Asleep in enclosure bed with sitter present. 1200-1900:  Several episodes wandering in hall ( with staff)  hyper- verbal, loud tone at times; entered elevator at one point. Able to redirect back to room and bed each time, reminding pt. Dr. Riley KillSwartz may be in to check on him. Lab was able to draw bloodwork ordered; pt. told Dr. Riley KillSwartz needed to assess labs as a condition to discharge planning.  Pt. c/o LLE pain and "tingling" throughout day. Oxy.5mg  effective.

## 2016-06-29 NOTE — Progress Notes (Signed)
Patient became agitated this morning and was given 2mg  Haldol p.o. The patient remained calm and cooperative the remainder of the shift with no further aggressive actions or threats.

## 2016-06-29 NOTE — Progress Notes (Signed)
Occupational Therapy Session Note  Patient Details  Name: Travis Mcguire MRN: [id number] Date of Birth: 1996-08-01  Today's Date: 06/29/2016 OT Individual Time: [id number] OT Individual Time Calculation (min): 55 min     Short Term Goals: Week 3:  OT Short Term Goal 1 (Week 3): Pt will consistently (5/7 sessions) engage/participate in therapy without arguing with therapist OT Short Term Goal 2 (Week 3): Pt will not remove C-collar or CAM boot unless instructed by therapists or other staff OT Short Term Goal 3 (Week 3): Pt will not attempt to elope from the unit during the next reporting period.  Skilled Therapeutic Interventions/Progress Updates:    Pt initially engaged in completing breakfast; sitter present.  Therapist went to nursing station to retrieve chocolate pudding. Upon return pt insistent on leaving hospital and walking home.  Pt stated his LLE was very painful and he needed to go home.  Pt stated he would return at night.  Redirection strategies employed by various staff were initially unsuccessful.  Pt amb off unit escorted by therapist, sitter, and two other staff members.  Pt redirected back to unit but pt walked off unit two more times and entered elevator.  Pt redirected back onto unit.  MD intervened with no success.  Pt finally said he was tired and returned to enclosure bed and ice applied to his leg while RN administered medications.  Pt remained in bed with sitter present.  Therapy Documentation Precautions:  Precautions Precautions: Fall, Cervical Required Braces or Orthoses: Cervical Brace, Other Brace/Splint Cervical Brace: Hard collar Other Brace/Splint: L CAM boot for comfort, RUE splint Restrictions Weight Bearing Restrictions: Yes RUE Weight Bearing: Non weight bearing LUE Weight Bearing: Weight bearing as tolerated RLE Weight Bearing: Weight bearing as tolerated LLE Weight Bearing: Weight bearing as tolerated Other Position/Activity Restrictions: in CAM  boot General:   Vital Signs:   Pain:  Pt c/o increased pain with activity (unrated ADL: ADL ADL Comments: see functional navigator Exercises:   Other Treatments:    See Function Navigator for Current Functional Status.   Therapy/Group: Individual Therapy  [name], Travis Mcguire 06/29/2016, 9:31 AM

## 2016-06-30 ENCOUNTER — Inpatient Hospital Stay (HOSPITAL_COMMUNITY): Payer: Medicaid Other

## 2016-06-30 DIAGNOSIS — S12600D Unspecified displaced fracture of seventh cervical vertebra, subsequent encounter for fracture with routine healing: Secondary | ICD-10-CM

## 2016-06-30 LAB — VALPROIC ACID LEVEL: Valproic Acid Lvl: 88 ug/mL (ref 50.0–100.0)

## 2016-06-30 NOTE — Progress Notes (Signed)
Occupational Therapy Session Note  Patient Details  Name: Travis MessingDenzel L XXX Rosete MRN: [id number] Date of Birth: 31-Oct-1995  Today's Date: 06/30/2016 OT Individual Time: 1100-1153 OT Individual Time Calculation (min): 53 min   Short Term Goals: Week 3:  OT Short Term Goal 1 (Week 3): Pt will consistently (5/7 sessions) engage/participate in therapy without arguing with therapist OT Short Term Goal 2 (Week 3): Pt will not remove C-collar or CAM boot unless instructed by therapists or other staff OT Short Term Goal 3 (Week 3): Pt will not attempt to elope from the unit during the next reporting period.  Skilled Therapeutic Interventions/Progress Updates:   Therapeutic activity with focus on improved socialization, awareness, and adherence to precautions, decreased impulsivity resulting in elopement, and improved attention.   Pt received seated at EOB with blanket covering upper body.   Pt requested re-orientation and clarification on purpose of session and role of OT .   Pt received explanation for OT attentively for 2 minutes and then reported plan to leave facility. With sitter assist, pt was redirected to alternate topics as effective distraction throughout session, often challenging therapist although without hostility.  Pt attempted to leave again, carrying his belongings and basketball in plastic bag, but he did not venture past the RN station due to presence of security personnel.   Pt was escorted back to his room and redirected to attend to 1 game of "Casino" with OT as RN tech assisted with game play progression.   Pt became fatigued during game play and requested return to bed for rest 7 minutes prior to end of session.  Therapy Documentation Precautions:  Precautions Precautions: Fall, Cervical Required Braces or Orthoses: Cervical Brace, Other Brace/Splint Cervical Brace: Hard collar Other Brace/Splint: L CAM boot for comfort, RUE splint Restrictions Weight Bearing Restrictions: Yes RUE  Weight Bearing: Non weight bearing LUE Weight Bearing: Weight bearing as tolerated RLE Weight Bearing: Weight bearing as tolerated LLE Weight Bearing: Weight bearing as tolerated Other Position/Activity Restrictions: in CAM boot   General: General OT Amount of Missed Time: 7 Minutes   Vital Signs: Therapy Vitals Temp: 98.2 F (36.8 C) Temp Source: Oral Pulse Rate: (!) 103 BP: 118/71 Patient Position (if appropriate): Sitting Oxygen Therapy SpO2: 99 % O2 Device: Not Delivered   Pain: Pain Assessment Pain Assessment: Faces Faces Pain Scale: Hurts little more Pain Type: Acute pain Pain Location: Leg Pain Orientation: Left Pain Descriptors / Indicators: Aching Pain Frequency: Intermittent Pain Onset: With Activity Pain Intervention(s): Medication (See eMAR);Heat applied   ADL: ADL ADL Comments: see functional navigator   See Function Navigator for Current Functional Status.   Therapy/Group: Individual Therapy  Rosine Solecki 06/30/2016, 4:35 PM
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
## 2016-07-01 ENCOUNTER — Inpatient Hospital Stay (HOSPITAL_COMMUNITY): Payer: Medicaid Other | Admitting: Physical Therapy

## 2016-07-01 NOTE — Progress Notes (Signed)
Social Work Patient ID: Travis Mcguire, male   DOB: Feb 10, 1996, 20 y.o.   MRN: [id number]    Have discussed pt's behavior this week with mother who remains extremely concerned about being able to manage him at home.  She does report that she has spoken with family and feels she will be able to coordinate 24/7 supervision.  She is aware that pt's friend, Therese SarahShamika, has been managing his behavior well and has remained supportive, however, she is reluctant to "put that burden on her."  Continue to monitor behavioral issues.    Terrilynn Postell, LCSW

## 2016-07-01 NOTE — Progress Notes (Signed)
Patient became agitated in late afternoon, grabbed sitter's arm to pull her away from the door so he could leave. Sitter and tech got patient into enclosure bed. RN discussed safety plan with patient.  After calming down, patient was able to sit outside enclosure bed for period of time. Became agitated again, began to run down the hall toward emergency exit. Sitter returned to room. He said "it was just a joke." Security called, oversaw as Media plannersitter and tech got patient back into enclosure bed. Patient fell asleep in bed. Will report to oncoming RN.
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
## 2016-07-01 NOTE — Progress Notes (Signed)
Physical Therapy Session Note  Patient Details  Name: Travis Mcguire MRN: [id number] Date of Birth: Apr 13, 1996  Today's Date: 07/01/2016 PT Individual Time: 1005-1047 PT Individual Time Calculation (min): 42 min    Short Term Goals:   Week 3:  PT Short Term Goal 1 (Week 3): Pt will wear cervical collar and cam boot >50% of the time with min cuing PT Short Term Goal 2 (Week 3): Pt will tolerate standing for functional tasks x 10 minutes before fatigue  Skilled Therapeutic Interventions/Progress Updates:    Pt received asleep in bed with sitter present; pt easily awakened with verbal conversation. Pt transferred to sitting EOB and willing to play card game but then requested to eat breakfast. Therapist educated/encouraged pt to take small bites but pt with poor return demonstration. Throughout entire session therapist encouraged pt to don c-collar but pt unwilling. Passively oriented pt to season,day of week, & month. Multiple times during session pt reported need to call his mom & dad and pt easily redirected by therapist and sitter. On one occasion pt attempted to leave room and therapist able to redirect pt back inside room to don cam boot but pt continued to be unwilling to don cervical collar. Attempted to engage pt in card game but pt unable to attend to task even with max cuing and instruction. Pt very fatigued throughout entire session. At end of session pt left in bed with sitter present.   Therapy Documentation Precautions:  Precautions Precautions: Fall, Cervical Required Braces or Orthoses: Cervical Brace, Other Brace/Splint Cervical Brace: Hard collar Other Brace/Splint: L CAM boot for comfort, RUE splint Restrictions Weight Bearing Restrictions: Yes RUE Weight Bearing: Non weight bearing LUE Weight Bearing: Weight bearing as tolerated RLE Weight Bearing: Weight bearing as tolerated LLE Weight Bearing: Weight bearing as tolerated Other Position/Activity Restrictions: in  CAM boot   See Function Navigator for Current Functional Status.   Therapy/Group: Individual Therapy  Sandi MariscalVictoria M Analya Mcguire 07/01/2016, 12:20 PM

## 2016-07-01 NOTE — Progress Notes (Addendum)
Noted patient's girlfriend laying with him in the enclosure bed during shift changed 7p-7a. Enclosure bed remained unzipped during that time she's with him.Girlfriend left at around midnight. Patient remained asleep during rounds while enclosure bed remained unzipped. Sitter present in the room.

## 2016-07-01 NOTE — Progress Notes (Signed)
Restraint order expired at 1000, not renewed. Patient was out of enclosure bed or resting in bed with unzipped entry/exit from 0700 to 1900 9/23.

## 2016-07-02 ENCOUNTER — Inpatient Hospital Stay (HOSPITAL_COMMUNITY): Payer: Medicaid Other | Admitting: Speech Pathology

## 2016-07-02 ENCOUNTER — Inpatient Hospital Stay (HOSPITAL_COMMUNITY): Payer: Self-pay

## 2016-07-02 ENCOUNTER — Inpatient Hospital Stay (HOSPITAL_COMMUNITY): Payer: Medicaid Other | Admitting: Physical Therapy

## 2016-07-02 NOTE — Progress Notes (Signed)
Speech Language Pathology Daily Session Note  Patient Details  Name: Travis Mcguire MRN: [id number] Date of Birth: Oct 01, 1996  Today's Date: 07/02/2016 SLP Individual Time: [id number] SLP Individual Time Calculation (min): 30 min   Short Term Goals: Week 3: SLP Short Term Goal 1 (Week 3): Patient will identify 2 physical and 2 cognitive impairments with Min A verbal and question cues.  SLP Short Term Goal 2 (Week 3): Patient will demonstrate sustained attention to functional tasks for 30 minutes with Min A verbal cues for redirection.  SLP Short Term Goal 3 (Week 3): Patient will self-monitor and correct errors during functional tasks with Min A verbal and visual cues.  SLP Short Term Goal 4 (Week 3): Patient will demonstrate functional problem solving for basic and functional tasks with Min A verbal cues. SLP Short Term Goal 5 (Week 3): Patient will request to get back into bed during a 30 minute session no more than 3 times with Mod A question and verbal cues.  SLP Short Term Goal 6 (Week 3): Patient will wear his c-collar for 30 minutes with Min A verbal cues.   Skilled Therapeutic Interventions: Skilled treatment session focused on cognition goals. SLP facilitated session by providing Mod A verbal cues for redirection to tasks during 30 minute session. Pt perseverative on going home. Girlfriend present and helped redirect pt. He walked into hallway once but was easily redirected to room and tasks. Pt demonstrated functional problem solving for basic and functional tasks with Min A verbal cues. Pt left sitting on edge of bed with girlfriend, nursing aid present and nursing present. All needs were within reach. Continue plan of care.   Function:    Cognition Comprehension Comprehension assist level: Understands basic 50 - 74% of the time/ requires cueing 25 - 49% of the time  Expression   Expression assist level: Expresses basic 50 - 74% of the time/requires cueing 25 - 49% of the  time. Needs to repeat parts of sentences.  Social Interaction Social Interaction assist level: Interacts appropriately 50 - 74% of the time - May be physically or verbally inappropriate.  Problem Solving Problem solving assist level: Solves basic 50 - 74% of the time/requires cueing 25 - 49% of the time  Memory Memory assist level: Recognizes or recalls 50 - 74% of the time/requires cueing 25 - 49% of the time    Pain Pain Assessment Pain Assessment: Faces Faces Pain Scale: Hurts even more Pain Type: Acute pain Pain Location: Leg Pain Orientation: Left Pain Descriptors / Indicators: Aching Pain Frequency: Intermittent Pain Onset: On-going Pain Intervention(s): Medication (See eMAR)  Therapy/Group: Individual Therapy  Ellagrace Yoshida Dreama SaaOverton 07/02/2016, 3:33 PM

## 2016-07-02 NOTE — Progress Notes (Signed)
Physical Therapy Session Note  Patient Details  Name: Travis MessingDenzel L XXX Teems MRN: [id number] Date of Birth: Oct 05, 1996  Today's Date: 07/02/2016 PT Individual Time: 1303-1401 PT Individual Time Calculation (min): 58 min    Short Term Goals: Week 3:  PT Short Term Goal 1 (Week 3): Pt will wear cervical collar and cam boot >50% of the time with min cuing PT Short Term Goal 2 (Week 3): Pt will tolerate standing for functional tasks x 10 minutes before fatigue  Skilled Therapeutic Interventions/Progress Updates:    Pt received sitting on EOB with sitter & cousin present & pt agreeable to tx. Pt noted to have c-collar & L CAM boot already donned. Pt participated in Wii bowling, completing multiple sit<>stand transfers and tolerating standing for up to 3 minutes at a time to participate in game. Pt also played connect 4, successfully recalling instructions without cuing. Throughout session PT provided positive reinforcement regardin wearing c-collar & L CAM boot. Pt removed c-collar for ~15 minutes but then agreeable to re-donning it. Pt perseverative on his room being new to him; therapist & family repeatedly educated pt on current location without success. Pt also reported his LLE "hurts when I stand on it for awhile" & therapist reinforced need to wear CAM boot. At end of session pt left sitting on edge of open enclosure bed with cousin, girlfriend, and sitter present.  Therapy Documentation Precautions:  Precautions Precautions: Fall, Cervical Required Braces or Orthoses: Cervical Brace, Other Brace/Splint Cervical Brace: Hard collar Other Brace/Splint: L CAM boot for comfort, RUE splint Restrictions Weight Bearing Restrictions: Yes RUE Weight Bearing: Non weight bearing LUE Weight Bearing: Weight bearing as tolerated RLE Weight Bearing: Weight bearing as tolerated LLE Weight Bearing: Weight bearing as tolerated Other Position/Activity Restrictions: in CAM boot   See Function Navigator for  Current Functional Status.   Therapy/Group: Individual Therapy  Sandi MariscalVictoria M Syrai Gladwin 07/02/2016, 6:05 PM

## 2016-07-02 NOTE — Progress Notes (Signed)
Patient tried to leave the floor twice already on shift. Redirected many times, meds were given, but patient later spit them out. Security was called twice. Patient also started to grab techs & was discouraged against it & told it was not acceptable. He walked the hall multiple times asking for the exit & tried to access one of the exit doors. Safety sitter in place & walked with patient around the dayroom when told it was the exit. No acute distress noted. Will continue to monitor

## 2016-07-02 NOTE — Progress Notes (Signed)
Late Entry;  Patient became escalated, verbally abusive, and attempted to leave unit multiple times this AM by getting on elevator. Patient was stopped by this RN and patient sitter by blocking elevator doors. Patient was redirected back to his room by multiple staff members only to attempt to leave again. Patient refusing to wear Aspen Collar for neck, Cam boot for LLE, and RUE splint. Patient c/o of LLE pain and aggreeable to pain medication but states, "he is leaving after and no one can keep him here because he knows his rights." Patient given 100mg  Tramadol and 2mg  Haldol po. Patient tried to leave unit again but was redirect back to bed and is now sleeping. Will pass along to oncoming RN.

## 2016-07-02 NOTE — Progress Notes (Signed)
Speech Language Pathology Daily Session Note  Patient Details  Name: Travis Mcguire MRN: [id number] Date of Birth: 11-23-1995  Today's Date: 07/02/2016 SLP Individual Time: 0930-1000 SLP Individual Time Calculation (min): 30 min   Short Term Goals: Week 3: SLP Short Term Goal 1 (Week 3): Patient will identify 2 physical and 2 cognitive impairments with Min A verbal and question cues.  SLP Short Term Goal 2 (Week 3): Patient will demonstrate sustained attention to functional tasks for 30 minutes with Min A verbal cues for redirection.  SLP Short Term Goal 3 (Week 3): Patient will self-monitor and correct errors during functional tasks with Min A verbal and visual cues.  SLP Short Term Goal 4 (Week 3): Patient will demonstrate functional problem solving for basic and functional tasks with Min A verbal cues. SLP Short Term Goal 5 (Week 3): Patient will request to get back into bed during a 30 minute session no more than 3 times with Mod A question and verbal cues.  SLP Short Term Goal 6 (Week 3): Patient will wear his c-collar for 30 minutes with Min A verbal cues.   Skilled Therapeutic Interventions: Skilled treatment session focused on cognitive goals. Upon arrival, patient was sitting upright in the recliner and appeared lethargic. SLP facilitated session by providing Max A verbal cues for attention, problem solving, recall and awareness during a basic word generation task. Patient's function impacted by fatigue with patient falling asleep intermittently throughout task. Patient also with decreased speech intelligibility due to impaired precision and was ~50-75% intelligible, suspect due to fatigue. RN made aware. Patient left supine in unzipped enclosure bed  with sitter present. Continue with current plan of care.   Function:  Cognition Comprehension Comprehension assist level: Understands basic 50 - 74% of the time/ requires cueing 25 - 49% of the time  Expression   Expression assist  level: Expresses basic 50 - 74% of the time/requires cueing 25 - 49% of the time. Needs to repeat parts of sentences.  Social Interaction Social Interaction assist level: Interacts appropriately 50 - 74% of the time - May be physically or verbally inappropriate.  Problem Solving Problem solving assist level: Solves basic 50 - 74% of the time/requires cueing 25 - 49% of the time  Memory Memory assist level: Recognizes or recalls 50 - 74% of the time/requires cueing 25 - 49% of the time    Pain No/Denies Pain   Therapy/Group: Individual Therapy  Travis Mcguire 07/02/2016, 10:30 AM
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
## 2016-07-02 NOTE — Progress Notes (Signed)
Patient remained asleep till 0530 am. Patient got up, used the bathroom, offered fluids, calm and cooperative. Safety maintained, sitter present in the room. Will continue to monitor and assess.
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
## 2016-07-02 NOTE — Progress Notes (Signed)
Patient became agitated around 1630. Began packing to leave unit. Was not able to redirect him. Security called. PRN haldol given and patient returned to enclosure bed. Did not calm, called out repeatedly. Staff responded with limit setting. Patient tore at bed and created opening. Staff in room, he went through the opening and out of the bed. Security paged and new bed ordered. Patient given IM ativan, returned to bed by NT. Able to eat dinner calmly and resting.
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
## 2016-07-02 NOTE — Progress Notes (Signed)
Occupational Therapy Session Note  Patient Details  Name: Travis MessingDenzel L XXX Kondracki MRN: [id number] Date of Birth: [birth year]  Today's Date: 07/02/2016 OT Individual Time: [id number] OT Individual Time Calculation (min): 55 min     Short Term Goals:Week 3:  OT Short Term Goal 1 (Week 3): Pt will consistently (5/7 sessions) engage/participate in therapy without arguing with therapist OT Short Term Goal 2 (Week 3): Pt will not remove C-collar or CAM boot unless instructed by therapists or other staff OT Short Term Goal 3 (Week 3): Pt will not attempt to elope from the unit during the next reporting period.  Skilled Therapeutic Interventions/Progress Updates:    Pt engaged in BADL retraining including bathing at shower level and dressing with sit<>stand from EOB.  Pt stood during shower with no LOB noted.  Pt with LOB X 1 when attempting to don pants while standing.  Reeducated pt on safe techniques for threading pants but pt continues to stand to thread pants.  Pt gathered dirty clothing and took to laundry room.  Pt requires more than a reasonable amount of time to complete tasks with max verbal cues for redirection to task.  Pt lethargic this morning with slightly slurred speech.  Pt stated he was "really tired."  Focus on task initiation, sequencing, attention to task, standing balance, and safety awareness to increase independence with BADLs.   Therapy Documentation Precautions:  Precautions Precautions: Fall, Cervical Required Braces or Orthoses: Cervical Brace, Other Brace/Splint Cervical Brace: Hard collar Other Brace/Splint: L CAM boot for comfort, RUE splint Restrictions Weight Bearing Restrictions: Yes RUE Weight Bearing: Non weight bearing LUE Weight Bearing: Weight bearing as tolerated RLE Weight Bearing: Weight bearing as tolerated LLE Weight Bearing: Weight bearing as tolerated Other Position/Activity Restrictions: in CAM boot Pain: Pt denies pain ADL: ADL ADL Comments: see  functional navigator  See Function Navigator for Current Functional Status.   Therapy/Group: Individual Therapy  [name], Jamarcus Laduke Chappell 07/02/2016, 9:23 AM

## 2016-07-03 ENCOUNTER — Inpatient Hospital Stay (HOSPITAL_COMMUNITY): Payer: Self-pay | Admitting: Physical Therapy

## 2016-07-03 ENCOUNTER — Inpatient Hospital Stay (HOSPITAL_COMMUNITY): Payer: Medicaid Other | Admitting: Speech Pathology

## 2016-07-03 ENCOUNTER — Inpatient Hospital Stay (HOSPITAL_COMMUNITY): Payer: Self-pay | Admitting: Occupational Therapy

## 2016-07-03 ENCOUNTER — Inpatient Hospital Stay (HOSPITAL_COMMUNITY): Payer: Self-pay

## 2016-07-03 MED ORDER — RISPERIDONE 1 MG PO TBDP
1.0000 mg | ORAL_TABLET | Freq: Once | ORAL | Status: AC
  Administered 2016-07-03: 1 mg via ORAL
  Filled 2016-07-03: qty 1

## 2016-07-03 MED ORDER — RISPERIDONE 0.5 MG PO TBDP
0.5000 mg | ORAL_TABLET | Freq: Two times a day (BID) | ORAL | Status: DC
  Filled 2016-07-03: qty 1

## 2016-07-03 MED ORDER — HALOPERIDOL LACTATE 5 MG/ML IJ SOLN
2.0000 mg | Freq: Once | INTRAMUSCULAR | Status: AC
  Administered 2016-07-03: 2 mg via INTRAMUSCULAR

## 2016-07-03 MED ORDER — CITALOPRAM HYDROBROMIDE 20 MG PO TABS
20.0000 mg | ORAL_TABLET | Freq: Every day | ORAL | Status: DC
  Administered 2016-07-03 – 2016-07-13 (×9): 20 mg via ORAL
  Filled 2016-07-03 (×11): qty 1

## 2016-07-03 MED ORDER — PROPRANOLOL HCL 20 MG PO TABS
20.0000 mg | ORAL_TABLET | Freq: Three times a day (TID) | ORAL | Status: DC
  Administered 2016-07-03 – 2016-07-05 (×6): 20 mg via ORAL
  Filled 2016-07-03 (×6): qty 1

## 2016-07-03 MED ORDER — RISPERIDONE 1 MG PO TBDP
1.0000 mg | ORAL_TABLET | Freq: Two times a day (BID) | ORAL | Status: DC
  Administered 2016-07-04 – 2016-07-09 (×11): 1 mg via ORAL
  Filled 2016-07-03 (×12): qty 1
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
## 2016-07-03 NOTE — Progress Notes (Signed)
Physical Therapy Session Note  Patient Details  Name: Travis Mcguire MRN: [id number] Date of Birth: Jul 12, 1996  Today's Date: 07/03/2016  Pt missed 60 minutes of scheduled PT treatment time 2/2 increased agitation.   General: PT Amount of Missed Time (min): 60 Minutes PT Missed Treatment Reason: Increased agitation   See Function Navigator for Current Functional Status.   Therapy/Group: Individual Therapy  Sandi MariscalVictoria M Lain Tetterton 07/03/2016, 1:07 PM

## 2016-07-03 NOTE — Progress Notes (Signed)
Speech Language Pathology Daily Session Note  Patient Details  Name: Gustavus MessingDenzel L XXX Brinson MRN: [id number] Date of Birth: [birth year]  Today's Date: 07/03/2016 SLP Individual Time: 0830-0910 SLP Individual Time Calculation (min): 40 min and Today's Date: 07/03/2016 SLP Missed Time: 20 Minutes Missed Time Reason: Increased agitation    Short Term Goals: Week 3: SLP Short Term Goal 1 (Week 3): Patient will identify 2 physical and 2 cognitive impairments with Min A verbal and question cues.  SLP Short Term Goal 2 (Week 3): Patient will demonstrate sustained attention to functional tasks for 30 minutes with Min A verbal cues for redirection.  SLP Short Term Goal 3 (Week 3): Patient will self-monitor and correct errors during functional tasks with Min A verbal and visual cues.  SLP Short Term Goal 4 (Week 3): Patient will demonstrate functional problem solving for basic and functional tasks with Min A verbal cues. SLP Short Term Goal 5 (Week 3): Patient will request to get back into bed during a 30 minute session no more than 3 times with Mod A question and verbal cues.  SLP Short Term Goal 6 (Week 3): Patient will wear his c-collar for 30 minutes with Min A verbal cues.   Skilled Therapeutic Interventions: Skilled treatment session focused on cognitive goals. Upon arrival, patient was verbally agitated and attempting to leave the unit. The goal of the session was to de-escalate patient. SLP facilitated session by providing environmental modifications, keeping a calm demeanor and attempting to provide encouragement for cooperation. Patient eventually agreeable to get into enclosure bed with security outside door. Patient required total A for recall after a 5 second delay, focused attention to tasks and reasoning/decision making. Patient left zipped in enclosure bed with sitter present. Continue with current plan of care.   Function:   Cognition Comprehension Comprehension assist level: Understands  basic 50 - 74% of the time/ requires cueing 25 - 49% of the time  Expression   Expression assist level: Expresses basic 50 - 74% of the time/requires cueing 25 - 49% of the time. Needs to repeat parts of sentences.  Social Interaction Social Interaction assist level: Interacts appropriately 25 - 49% of time - Needs frequent redirection.  Problem Solving Problem solving assist level: Solves basic less than 25% of the time - needs direction nearly all the time or does not effectively solve problems and may need a restraint for safety  Memory Memory assist level: Recognizes or recalls less than 25% of the time/requires cueing greater than 75% of the time    Pain Pain Assessment Pain Assessment: No/denies pain  Therapy/Group: Individual Therapy  Lane Kjos 07/03/2016, 11:39 AM

## 2016-07-03 NOTE — Progress Notes (Signed)
Occupational Therapy Note  Patient Details  Name: Travis Mcguire MRN: [id number] Date of Birth: June 02, 1996  Today's Date: 07/03/2016 OT Missed Time: 60 Minutes Missed Time Reason: Other (comment) (increased agitation)   Pt missed 60 mins skilled OT services secondary to increased agitation.   [name], [name] [facility] Center[facility]appell 07/03/2016, 10:56 AM
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
## 2016-07-04 ENCOUNTER — Inpatient Hospital Stay (HOSPITAL_COMMUNITY): Payer: Medicaid Other | Admitting: Speech Pathology

## 2016-07-04 ENCOUNTER — Inpatient Hospital Stay (HOSPITAL_COMMUNITY): Payer: Medicaid Other | Admitting: Physical Therapy

## 2016-07-04 ENCOUNTER — Inpatient Hospital Stay (HOSPITAL_COMMUNITY): Payer: Self-pay
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
## 2016-07-04 NOTE — Progress Notes (Signed)
Speech Language Pathology Daily Session Note  Patient Details  Name: Travis RalphDenzel L XXXShaw MRN: [id number] Date of Birth: [birth year]  Today's Date: 07/04/2016 SLP Individual Time: 1405-1440 SLP Individual Time Calculation (min): 35 min   Short Term Goals: Week 3: SLP Short Term Goal 1 (Week 3): Patient will identify 2 physical and 2 cognitive impairments with Min A verbal and question cues.  SLP Short Term Goal 2 (Week 3): Patient will demonstrate sustained attention to functional tasks for 30 minutes with Min A verbal cues for redirection.  SLP Short Term Goal 3 (Week 3): Patient will self-monitor and correct errors during functional tasks with Min A verbal and visual cues.  SLP Short Term Goal 4 (Week 3): Patient will demonstrate functional problem solving for basic and functional tasks with Min A verbal cues. SLP Short Term Goal 5 (Week 3): Patient will request to get back into bed during a 30 minute session no more than 3 times with Mod A question and verbal cues.  SLP Short Term Goal 6 (Week 3): Patient will wear his c-collar for 30 minutes with Min A verbal cues.   Skilled Therapeutic Interventions: Skilled treatment session focused on cognitive goals. Upon arrival, patient was sitting upright in the recliner while eating his lunch meal. Patient engaged in a social and appropriate conversation while eating his meal for ~10 minutes. Patient initiated talking to this clinician about the "list" that is taped to his wall that lists the things he needs to do in order to discharge home. Patient independently read the list and clinician provided re-education in the importance of completing these items consistently over several days with patient verbalizing agreement. After reading the list, patient donned his cam boot and C-collar with Max A for sequencing and problem solving with task. However, after an ~2 minute delay, [facility] and proceeded to walk to the  door and yell out to strangers in the hallway to request a ride. Patient consistently disoriented to place and situation and required total A throughout the session. Patient eventually redirected and was left reclined in chair. Patient left upright with sitter present. Continue with current plan of care.   Function:  Eating Eating   Modified Consistency Diet: No Eating Assist Level: No help, No cues           Cognition Comprehension Comprehension assist level: Understands basic 50 - 74% of the time/ requires cueing 25 - 49% of the time  Expression   Expression assist level: Expresses basic 75 - 89% of the time/requires cueing 10 - 24% of the time. Needs helper to occlude trach/needs to repeat words.  Social Interaction Social Interaction assist level: Interacts appropriately 25 - 49% of time - Needs frequent redirection.  Problem Solving Problem solving assist level: Solves basic less than 25% of the time - needs direction nearly all the time or does not effectively solve problems and may need a restraint for safety  Memory Memory assist level: Recognizes or recalls less than 25% of the time/requires cueing greater than 75% of the time    Pain No/Denies Pain   Therapy/Group: Individual Therapy  Hendrick Pavich 07/04/2016, 3:11 PM
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
## 2016-07-04 NOTE — Progress Notes (Signed)
Social Work Patient ID: Travis Mcguire, male   DOB: 1996/08/03, 20 y.o.   MRN: [id number]  Reviewed team conference/ discussion with pt's mother via phone yesterday.  She had already spoken with pt's girlfriend, Travis Mcguire, and was aware of his significant increase in agitated and aggressive behavior yesterday.  She is agreeable to team recommendation that pt have no visitors for at least a couple of days while we work to decreased agitation with medication changes.  Today relatively "quiet" for pt until approx 30 mins ago and now becoming increasingly agitated again.  Psychiatry in to see pt this morning.  Continue to follow and hope that behavior can improve to point that family can provide care at home.  Adreonna Yontz, LCSW

## 2016-07-04 NOTE — Progress Notes (Signed)
Occupational Therapy Session Note  Patient Details  Name: Travis Mcguire MRN: [id number] Date of Birth: [birth year]  Today's Date: 07/04/2016 OT Individual Time: 1015-1057 OT Individual Time Calculation (min): 42 min     Short Term Goals: Week 3:  OT Short Term Goal 1 (Week 3): Pt will consistently (5/7 sessions) engage/participate in therapy without arguing with therapist OT Short Term Goal 2 (Week 3): Pt will not remove C-collar or CAM boot unless instructed by therapists or other staff OT Short Term Goal 3 (Week 3): Pt will not attempt to elope from the unit during the next reporting period.  Skilled Therapeutic Interventions/Progress Updates:    Pt resting in recliner upon arrival with sitter present.  Pt engaged in bathing at shower level and dressing while standing.  Pt educated on importance of sitting to doff/don pants but pt insisted on standing on one LE to doff/don pants and socks.  Pt completed shower while standing.  Pt returned to recliner upon completion and stated he was too tired to "don anything else." Reeducated pt on goals while in rehab.  Pt verbalized understanding.  Pt asked numerous times if he could call his girlfriend.  Pt reminded that his goal is to "behave" for two days before he can call.  Pt remained in recliner with sitter present.  Therapy Documentation Precautions:  Precautions Precautions: Fall, Cervical Required Braces or Orthoses: Cervical Brace, Other Brace/Splint Cervical Brace: Hard collar Other Brace/Splint: L CAM boot for comfort, RUE splint Restrictions Weight Bearing Restrictions: Yes RUE Weight Bearing: Non weight bearing LUE Weight Bearing: Weight bearing as tolerated RLE Weight Bearing: Weight bearing as tolerated LLE Weight Bearing: Weight bearing as tolerated Other Position/Activity Restrictions: in CAM boot General: General OT Amount of Missed Time: 18 Minutes Vital Signs:   Pain: Pain Assessment Faces Pain Scale: Hurts little  more Pain Location: Leg Pain Orientation: Left Pain Descriptors / Indicators: Aching Pain Intervention(s): Refused (pt refused oxycodone when rn brought medication to room) ADL: ADL ADL Comments: see functional navigator Exercises:   Other Treatments:    See Function Navigator for Current Functional Status.   Therapy/Group: Individual Therapy  [name], [name] 07/04/2016, 10:58 AM

## 2016-07-04 NOTE — Progress Notes (Addendum)
Physical Therapy Session Note  Patient Details  Name: Travis Mcguire MRN: [id number] Date of Birth: 1996-01-15  Today's Date: 07/04/2016 PT Individual Time: [id number] PT Individual Time Calculation (min): 38 min  Pt missed 22 minutes of skilled PT treatment time 2/2 fatigue.   Short Term Goals: Week 3:  PT Short Term Goal 1 (Week 3): Pt will wear cervical collar and cam boot >50% of the time with min cuing PT Short Term Goal 2 (Week 3): Pt will tolerate standing for functional tasks x 10 minutes before fatigue  Skilled Therapeutic Interventions/Progress Updates:    Pt received in recliner with NT's present in room. Pt with increased agitation reporting he wanted to go home and to his grandmother's house. Performed de-escalating techniques and reoriented pt to situation. Pt perseverative on going for a walk, leaving hospital.  Reeducated pt on need to stay in room due to MD's orders. Pt reluctantly agreeable to teaching therapist how to play card game. Pt provided minimal instruction but attended to task 100% of the time once game initiated. Pt with slurred speech on occasion, suspect fatigue. Pt beginning to fall asleep at end of session. Pt ambulated recliner>bed x 5 ft with supervision. Pt left in bed with NT present.   Therapy Documentation Precautions:  Precautions Precautions: Fall, Cervical Required Braces or Orthoses: Cervical Brace, Other Brace/Splint Cervical Brace: Hard collar Other Brace/Splint: L CAM boot for comfort, RUE splint Restrictions Weight Bearing Restrictions: Yes RUE Weight Bearing: Non weight bearing LUE Weight Bearing: Weight bearing as tolerated RLE Weight Bearing: Weight bearing as tolerated LLE Weight Bearing: Weight bearing as tolerated Other Position/Activity Restrictions: in CAM boot   See Function Navigator for Current Functional Status.   Therapy/Group: Individual Therapy  [name] 07/04/2016, 12:01 PM
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
## 2016-07-05 ENCOUNTER — Inpatient Hospital Stay (HOSPITAL_COMMUNITY): Payer: Medicaid Other | Admitting: Speech Pathology

## 2016-07-05 ENCOUNTER — Inpatient Hospital Stay (HOSPITAL_COMMUNITY): Payer: Self-pay

## 2016-07-05 ENCOUNTER — Inpatient Hospital Stay (HOSPITAL_COMMUNITY): Payer: Self-pay | Admitting: Physical Therapy

## 2016-07-05 MED ORDER — PROPRANOLOL HCL 20 MG PO TABS
20.0000 mg | ORAL_TABLET | Freq: Two times a day (BID) | ORAL | Status: DC
  Filled 2016-07-05 (×3): qty 1

## 2016-07-05 MED ORDER — CLONAZEPAM 0.5 MG PO TABS
1.0000 mg | ORAL_TABLET | Freq: Every day | ORAL | Status: DC
  Administered 2016-07-05: 1 mg via ORAL
  Filled 2016-07-05: qty 2

## 2016-07-05 NOTE — Progress Notes (Signed)
Occupational Therapy Session Note  Patient Details  Name: Travis Mcguire MRN: [id number] Date of Birth: 1995-12-31  Today's Date: 07/05/2016 OT Individual Time: 1000-1030 OT Individual Time Calculation (min): 30 min  Pt missed 30 mins skilled OT services; fatigue and refusal to actively participate    Short Term Goals: Week 3:  OT Short Term Goal 1 (Week 3): Pt will consistently (5/7 sessions) engage/participate in therapy without arguing with therapist OT Short Term Goal 2 (Week 3): Pt will not remove C-collar or CAM boot unless instructed by therapists or other staff OT Short Term Goal 3 (Week 3): Pt will not attempt to elope from the unit during the next reporting period.  Skilled Therapeutic Interventions/Progress Updates:    Pt sitting EOB upon arrival with sitter present.  Pt requested use of telephone to call his girl friend.  Pt reminded that he couldn't make any phone calls.  Pt wandered out of room but easily redirected.  Pt wandered out of room two more times and redirected each time.  Pt engaged in card game for approx 10 mins and then stated he was going to make a phone call.  Pt reminded again that no phone calls until after therapy if he participated in therapy.  Pt stated he was tired and was going to go to sleep.  Therapy Documentation Precautions:  Precautions Precautions: Fall, Cervical Required Braces or Orthoses: Cervical Brace, Other Brace/Splint Cervical Brace: Hard collar Other Brace/Splint: L CAM boot for comfort, RUE splint Restrictions Weight Bearing Restrictions: Yes RUE Weight Bearing: Non weight bearing LUE Weight Bearing: Weight bearing as tolerated RLE Weight Bearing: Weight bearing as tolerated LLE Weight Bearing: Weight bearing as tolerated Other Position/Activity Restrictions: in CAM boot General: General OT Amount of Missed Time: 30 Minutes Pain: Pt denied pain ADL:  See Function Navigator for Current Functional Status.   Therapy/Group:  Individual Therapy  [name], Brendan Gruwell Chappell 07/05/2016, 10:44 AM

## 2016-07-05 NOTE — Progress Notes (Signed)
Pt is attempting to leave unit with multiple attempts at redirection. RN administered medication for anxiety and agitation. Will continue to redirect patient and monitor.

## 2016-07-05 NOTE — Progress Notes (Signed)
Speech Language Pathology Weekly Progress and Session Note  Patient Details  Name: Travis Mcguire MRN: [id number] Date of Birth: 04-21-1996  Beginning of progress report period: June 28, 2016 End of progress report period: July 05, 2016  Today's Date: 07/05/2016 SLP Individual Time: 1110-1135 SLP Individual Time Calculation (min): 25 min   Short Term Goals: Week 3: SLP Short Term Goal 1 (Week 3): Patient will identify 2 physical and 2 cognitive impairments with Min A verbal and question cues.  SLP Short Term Goal 1 - Progress (Week 3): Not met SLP Short Term Goal 2 (Week 3): Patient will demonstrate sustained attention to functional tasks for 30 minutes with Min A verbal cues for redirection.  SLP Short Term Goal 2 - Progress (Week 3): Not met SLP Short Term Goal 3 (Week 3): Patient will self-monitor and correct errors during functional tasks with Min A verbal and visual cues.  SLP Short Term Goal 3 - Progress (Week 3): Not met SLP Short Term Goal 4 (Week 3): Patient will demonstrate functional problem solving for basic and functional tasks with Min A verbal cues. SLP Short Term Goal 4 - Progress (Week 3): Not met SLP Short Term Goal 5 (Week 3): Patient will request to get back into bed during a 30 minute session no more than 3 times with Mod A question and verbal cues.  SLP Short Term Goal 5 - Progress (Week 3): Not met SLP Short Term Goal 6 (Week 3): Patient will wear his c-collar for 30 minutes with Min A verbal cues.  SLP Short Term Goal 6 - Progress (Week 3): Not met    New Short Term Goals: Week 4: SLP Short Term Goal 1 (Week 4): Patient will request to get back into bed or leave hospital during a 30 minute session no more than 3 times with Mod A question and verbal cues.  SLP Short Term Goal 2 (Week 4): Patient will wear his c-collar for 15 minutes with Mod A verbal cues.  SLP Short Term Goal 3 (Week 4): Patient will demonstrate functional problem solving for basic  and functional tasks with Mod A verbal cues. SLP Short Term Goal 4 (Week 4): Patient will demonstrate sustained attention to functional tasks for 15 minutes with Mod A verbal cues for redirection.  SLP Short Term Goal 5 (Week 4): Patient will identify 2 physical and 2 cognitive impairments with Mod A verbal and question cues.   Weekly Progress Updates: Patient has not made any gains and has not met any STG's this reporting period. Currently, patient demonstrates behaviors consistent with a Rancho Level IV-VI. Patient continues with verbal agitation, physical aggression and multiple attempts to leave the unit on a daily basis. Patient requires Max-total A for recall of functional information, awareness of deficits/precuations with wearing of multiple braces, sustained attention and problem solving. Patient continues to require a sitter with intermittent use of restraints. Patient and family education is ongoing. Patient would benefit from continued skilled SLP intervention to maximize cognitive function and overall independence prior to discharge.    Intensity: Minumum of 1-2 x/day, 30 to 90 minutes Frequency: 3 to 5 out of 7 days Duration/Length of Stay: TBD, ~1.5 weeks  Treatment/Interventions: Cognitive remediation/compensation;Cueing hierarchy;Functional tasks;Patient/family education;Dysphagia/aspiration precaution training;Internal/external aids;Environmental controls;Therapeutic Activities  Daily Session  Skilled Therapeutic Interventions: Skilled treatment session focused on cognitive goals. SLP facilitated session by providing extra time and Max encouragement for patient to participate in treatment session. Patient was oriented to place and situation with Min  A question cues and identified 3 cognitive impairments with Max A question cues. Patient perseverative on going home, therefore, SLP generated a list of goals patient needed to complete within the 30 minute session to redirect  behavior/attention. However, patient able to complete 1 of 4 goals despite Max A multimodal cues (unable to participate for 30 minutes, unable to wear c-collar for 30 minutes and unable to not ask clinician to leave unit/go to bed). Patient requested to end session early so he could rest. However, after 2 minutes patient ambulating around hallway with sitter present. Patient reminded by this clinician he wanted to "rest" and that he needed to go back to his room. Patient left with sitter. Continue with current plan of care.     Function:   Cognition Comprehension Comprehension assist level: Understands basic 50 - 74% of the time/ requires cueing 25 - 49% of the time  Expression   Expression assist level: Expresses basic 75 - 89% of the time/requires cueing 10 - 24% of the time. Needs helper to occlude trach/needs to repeat words.  Social Interaction Social Interaction assist level: Interacts appropriately 25 - 49% of time - Needs frequent redirection.  Problem Solving Problem solving assist level: Solves basic less than 25% of the time - needs direction nearly all the time or does not effectively solve problems and may need a restraint for safety  Memory Memory assist level: Recognizes or recalls less than 25% of the time/requires cueing greater than 75% of the time   Pain No/Denies Pain   Therapy/Group: Individual Therapy  Anastasha Ortez 07/05/2016, 3:19 PM
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
## 2016-07-05 NOTE — Progress Notes (Signed)
Physical Therapy Note  Patient Details  Name: Travis RalphDenzel L XXXShaw MRN: [id number] Date of Birth: Mar 19, 1996 Today's Date: 07/05/2016    Time: 1400-1433 33 minutes  1:1 Pt with no c/o pain. Pt sleeping when PT entered room, nursing informed PT that MD would like pt to be awake more during the day. Pt easily aroused, agreeable to conversation with therapist and eventually agreed to play cards. Pt only able to attend to card game <5 minutes before being distracted by wanting to make a phone call and then wanting to go home. Pt continuously asking therapist for a ride home, pt able to be redirected but quickly circles back to going home.  Pt then agreeable to play board game with therapist, as therapist went to get game pt decided he would rather sleep and was unable to be encouraged to further participate.   Haely Leyland 07/05/2016, 2:49 PM
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
## 2016-07-05 NOTE — Progress Notes (Signed)
Patient cooperative this afternoon. Patient able to be redirected as needed to room by RN. Patient able to verbalize rules to follow to be allowed to talk on phone to girlfriend "Shameka". Patient tolerating talking with RN. Patient cooperative taking medications as prescribed. Continue with plan of care.  Cleotilde NeerJoyce, Hawraa Stambaugh S

## 2016-07-06 ENCOUNTER — Inpatient Hospital Stay (HOSPITAL_COMMUNITY): Payer: Medicaid Other | Admitting: Speech Pathology

## 2016-07-06 ENCOUNTER — Inpatient Hospital Stay (HOSPITAL_COMMUNITY): Payer: Self-pay

## 2016-07-06 ENCOUNTER — Inpatient Hospital Stay (HOSPITAL_COMMUNITY): Payer: Self-pay | Admitting: Physical Therapy

## 2016-07-06 MED ORDER — PROPRANOLOL HCL 20 MG PO TABS
20.0000 mg | ORAL_TABLET | Freq: Three times a day (TID) | ORAL | Status: DC
Start: 2016-07-06 — End: 2016-07-14
  Administered 2016-07-06 – 2016-07-14 (×21): 20 mg via ORAL
  Filled 2016-07-06 (×24): qty 1

## 2016-07-06 MED ORDER — CLONAZEPAM 0.5 MG PO TABS
1.0000 mg | ORAL_TABLET | Freq: Three times a day (TID) | ORAL | Status: DC
  Administered 2016-07-06 – 2016-07-09 (×8): 1 mg via ORAL
  Filled 2016-07-06 (×9): qty 2
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
## 2016-07-06 NOTE — Progress Notes (Signed)
Occupational Therapy Session Note  Patient Details  Name: Travis Mcguire MRN: [id number] Date of Birth: March 24, 1996  Today's Date: 07/06/2016 OT Individual Time: 0830-0900 OT Individual Time Calculation (min): 30 min     Short Term Goals: Week 4:  OT Short Term Goal 1 (Week 4): Pt will consistently (5/7 sessions) engage/participate in therapy without arguing with therapist OT Short Term Goal 2 (Week 4): Pt will not remove C-collar or CAM boot unless instructed by staff OT Short Term Goal 3 (Week 4): Pt will not attempt to elope from unit suring the next reporting period  Skilled Therapeutic Interventions/Progress Updates:    Pt engaged in BADLs at shower level this morning.  Pt stated he wasn't going to do anything else after his shower.  Pt completed bathing and dressing while standing and using the wall as support when threading his pants.  Pt requested to use the telephone X 4 during session.  Pt stated he was going to leave X 3. Pt returned to bed at end of session with sitter present.  Therapy Documentation Precautions:  Precautions Precautions: Fall, Cervical Required Braces or Orthoses: Cervical Brace, Other Brace/Splint Cervical Brace: Hard collar Other Brace/Splint: L CAM boot for comfort, RUE splint Restrictions Weight Bearing Restrictions: Yes RUE Weight Bearing: Non weight bearing LUE Weight Bearing: Weight bearing as tolerated RLE Weight Bearing: Weight bearing as tolerated LLE Weight Bearing: Weight bearing as tolerated Other Position/Activity Restrictions: in CAM boot Pain: Pt denies pain ADL: ADL ADL Comments: see functional navigator  See Function Navigator for Current Functional Status.   Therapy/Group: Individual Therapy  [name], Gurneet Matarese Chappell 07/06/2016, 11:32 AM

## 2016-07-06 NOTE — Progress Notes (Signed)
Physical Therapy Note  Patient Details  Name: Travis Mcguire MRN: [id number] Date of Birth: 1995/10/28 Today's Date: 07/06/2016    Time: 1030  Pt unable to participate in skilled PT session due to sleeping after having increased agitation and attempting to leave unit earlier today.  Will resume PT treatment as appropriate.   DONAWERTH,KAREN 07/06/2016, 10:59 AM
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
## 2016-07-06 NOTE — Progress Notes (Signed)
Physical Therapy Session Note  Patient Details  Name: Travis Mcguire MRN: [id number] Date of Birth: 1996-06-18  Today's Date: 07/06/2016 PT Individual Time: 1632-1700 PT Individual Time Calculation (min): 28 min    Short Term Goals: Week 3:  PT Short Term Goal 1 (Week 3): Pt will wear cervical collar and cam boot >50% of the time with min cuing PT Short Term Goal 1 - Progress (Week 3): Not met PT Short Term Goal 2 (Week 3): Pt will tolerate standing for functional tasks x 10 minutes before fatigue PT Short Term Goal 2 - Progress (Week 3): Not met Week 4:     Skilled Therapeutic Interventions/Progress Updates:     Paitent received supine in bed and agreeable to PT. PT instructed patient in sustained attention and Problem solving to play card games. Patient able to remained engaged in game for 15 minutes before getting distracted to eat. Patient demonstrated improved short term memory with accurate description of events from earlier in day as well as reason that he needs to stay in the hospital. Patient demonstrated no perseverative behavior in this therapy session. Patient left supine in bed with NT present.     Therapy Documentation Precautions:  Precautions Precautions: Fall, Cervical Required Braces or Orthoses: Cervical Brace, Other Brace/Splint Cervical Brace: Hard collar Other Brace/Splint: L CAM boot for comfort, RUE splint Restrictions Weight Bearing Restrictions: Yes RUE Weight Bearing: Non weight bearing LUE Weight Bearing: Weight bearing as tolerated RLE Weight Bearing: Weight bearing as tolerated LLE Weight Bearing: Weight bearing as tolerated Other Position/Activity Restrictions: in CAM boot General:   Vital Signs: Therapy Vitals Temp: 98.5 F (36.9 C) Temp Source: Oral Pulse Rate: (!) 106 Resp: 18 BP: (!) 100/56 Patient Position (if appropriate): Lying Oxygen Therapy SpO2: 99 % O2 Device: Not Delivered Pain: Pain Assessment Pain Assessment: No/denies  pain See Function Navigator for Current Functional Status.   Therapy/Group: Individual Therapy  Lorie Phenix 07/06/2016, 5:56 PM

## 2016-07-06 NOTE — Progress Notes (Signed)
Occupational Therapy Note  Patient Details  Name: Travis Mcguire MRN: [id number] Date of Birth: March 17, 1996  Today's Date: 07/06/2016 OT Individual Time: 1130-1145 OT Individual Time Calculation (min): 15 min  and Today's Date: 07/06/2016 OT Missed Time: 15 Minutes Missed Time Reason: Patient unwilling/refused to participate without medical reason    Pt denied pain Individual Therapy  Pt resting in bed upon arrival with sitter present.  Pt initially agreed to participate in game but declined game of Connect 4, Blink, or any card game.  Pt stated he was a boring person and didn't want to do any thing.  Pt missed 15 mins skilled OT services.   [name], Raeann Offner [facility]appell 07/06/2016, 11:55 AM

## 2016-07-06 NOTE — Progress Notes (Signed)
Occupational Therapy Weekly Progress Note  Patient Details  Name: Travis Mcguire MRN: [id number] Date of Birth: Mar 18, 1996  Beginning of progress report period: June 28, 2016 End of progress report period: July 06, 2016  Patient has met 0 of 3 short term goals.  Pt made no gains towards STGs this past period.  Pt exhibits behaviors consistent with [place] Level IV-V.Patient continues with verbal agitation, physical aggression and multiple attempts to leave the unit on a daily basis. Patient requires Max-total A for recall of functional information, awareness of deficits/precuations with wearing of multiple braces, sustained attention and problem solving. Patient continues to require a sitter with intermittent use of restraints. Patient and family education is ongoing. Patient continues to demonstrate the following deficits: impaired safety awareness, impaired attention, impaired immediate and short-term memory, impaired problem-solving and therefore will continue to benefit from skilled OT intervention to enhance overall performance with assisted self-care to included reduced care partner burden.  Patient not progressing toward long term goals.  See goal revision..  Plan of care revisions: See week 4 STG revisions with emphasis on improved compliance and safety awareness..  OT Short Term Goals Week 3:  OT Short Term Goal 1 (Week 3): Pt will consistently (5/7 sessions) engage/participate in therapy without arguing with therapist OT Short Term Goal 1 - Progress (Week 3): Progressing toward goal OT Short Term Goal 2 (Week 3): Pt will not remove C-collar or CAM boot unless instructed by therapists or other staff OT Short Term Goal 2 - Progress (Week 3): Progressing toward goal OT Short Term Goal 3 (Week 3): Pt will not attempt to elope from the unit during the next reporting period. OT Short Term Goal 3 - Progress (Week 3): Progressing toward goal Week 4:  OT Short Term Goal 1 (Week 4): Pt  will consistently (5/7 sessions) engage/participate in therapy without arguing with therapist OT Short Term Goal 2 (Week 4): Pt will not remove C-collar or CAM boot unless instructed by staff OT Short Term Goal 3 (Week 4): Pt will not attempt to elope from unit suring the next reporting period     Therapy Documentation Precautions:  Precautions Precautions: Fall, Cervical Required Braces or Orthoses: Cervical Brace, Other Brace/Splint Cervical Brace: Hard collar Other Brace/Splint: L CAM boot for comfort, RUE splint Restrictions Weight Bearing Restrictions: Yes RUE Weight Bearing: Non weight bearing LUE Weight Bearing: Weight bearing as tolerated RLE Weight Bearing: Weight bearing as tolerated LLE Weight Bearing: Weight bearing as tolerated Other Position/Activity Restrictions: in CAM boot  See Function Navigator for Current Functional Status.    [name] [facility] 07/06/2016, 6:51 AM

## 2016-07-06 NOTE — Progress Notes (Signed)
Physical Therapy Weekly Progress Note  Patient Details  Name: Travis Mcguire MRN: [id number] Date of Birth: 1996/10/06  Beginning of progress report period: June 29, 2016 End of progress report period: July 06, 2016   Patient has met 0 of 2 short term goals.  Pt with increasingly aggitated and aggressive behavior, difficulty participating in therapeutic activities due to perseveration on leaving unit and going home.  Sessions have been focused on redirecting pt and attempting to have pt engage in therapeutic task while increasing pt's awareness and memory.  Pt has not been compliant with wearing c collar or cam boot this week.  Patient continues to demonstrate the following deficits: awareness, safety, memory and therefore will continue to benefit from skilled PT intervention to enhance overall performance with activity tolerance, ability to compensate for deficits, attention, awareness and knowledge of precautions.  Patient progressing toward long term goals..  Continue plan of care.  PT Short Term Goals Week 3:  PT Short Term Goal 1 (Week 3): Pt will wear cervical collar and cam boot >50% of the time with min cuing PT Short Term Goal 1 - Progress (Week 3): Not met PT Short Term Goal 2 (Week 3): Pt will tolerate standing for functional tasks x 10 minutes before fatigue PT Short Term Goal 2 - Progress (Week 3): Not met   Skilled Therapeutic Interventions/Progress Updates: Ambulation/gait training;Balance/vestibular training;Cognitive remediation/compensation;Community reintegration;DME/adaptive equipment instruction;Discharge planning;Disease management/prevention;Functional mobility training;Neuromuscular re-education;Pain management;Patient/family education;Psychosocial support;Stair training;Therapeutic Activities;Therapeutic Exercise;UE/LE Strength taining/ROM;UE/LE Coordination activities;Wheelchair propulsion/positioning     See Function Navigator for Current Functional  Status.  [name] 07/06/2016, 7:20 AM

## 2016-07-06 NOTE — Progress Notes (Signed)
Speech Language Pathology Daily Session Note  Patient Details  Name: Travis Mcguire MRN: [id number] Date of Birth: 01-02-1996  Today's Date: 07/06/2016 SLP Individual Time: 1500-1530 SLP Individual Time Calculation (min): 30 min   Short Term Goals: Week 4: SLP Short Term Goal 1 (Week 4): Patient will request to get back into bed or leave hospital during a 30 minute session no more than 3 times with Mod A question and verbal cues.  SLP Short Term Goal 2 (Week 4): Patient will wear his c-collar for 15 minutes with Mod A verbal cues.  SLP Short Term Goal 3 (Week 4): Patient will demonstrate functional problem solving for basic and functional tasks with Mod A verbal cues. SLP Short Term Goal 4 (Week 4): Patient will demonstrate sustained attention to functional tasks for 15 minutes with Mod A verbal cues for redirection.  SLP Short Term Goal 5 (Week 4): Patient will identify 2 physical and 2 cognitive impairments with Mod A verbal and question cues.   Skilled Therapeutic Interventions:Pt seen for skilled SLP treatment targeting cognitive goals. Continued gains noted this date. Pt receptive to therapist direction with functional money counting - acknowledged errors and was agreeable to reattempt for corrections. Min A for moderate level reasoning. Min A for occasional redirection to maintain attention to task. Pt with appropriate language throughout session.     Function:  Eating Eating                 Cognition Comprehension Comprehension assist level: Understands basic 75 - 89% of the time/ requires cueing 10 - 24% of the time  Expression   Expression assist level: Expresses basic 90% of the time/requires cueing < 10% of the time.  Social Interaction Social Interaction assist level: Interacts appropriately 50 - 74% of the time - May be physically or verbally inappropriate.  Problem Solving Problem solving assist level: Solves basic 50 - 74% of the time/requires cueing 25 - 49% of  the time  Memory Memory assist level: Recognizes or recalls 75 - 89% of the time/requires cueing 10 - 24% of the time    Pain Pain Assessment Pain Assessment: No/denies pain  Therapy/Group: Individual Therapy  Rocky CraftsKara E Cartel Mauss MA, CCC-SLP 07/06/2016, 3:30 PM

## 2016-07-07 ENCOUNTER — Inpatient Hospital Stay (HOSPITAL_COMMUNITY): Payer: Self-pay | Admitting: Physical Therapy

## 2016-07-07 ENCOUNTER — Inpatient Hospital Stay (HOSPITAL_COMMUNITY): Payer: Medicaid Other

## 2016-07-07 NOTE — Progress Notes (Signed)
Occupational Therapy Session Note  Patient Details  Name: Travis Mcguire MRN: [id number] Date of Birth: 08/28/1996  Today's Date: 07/07/2016 OT Individual Time: 1300-1347 OT Individual Time Calculation (min): 47 min   Short Term Goals: Week 4:  OT Short Term Goal 1 (Week 4): Pt will consistently (5/7 sessions) engage/participate in therapy without arguing with therapist OT Short Term Goal 2 (Week 4): Pt will not remove C-collar or CAM boot unless instructed by staff OT Short Term Goal 3 (Week 4): Pt will not attempt to elope from unit during the next reporting period  Skilled Therapeutic Interventions/Progress Updates:   Therapeutic activity with focus on improved compliance with orthotics, engagement in activity w/o arguing w/therapist, and reduced attempts to elope.   Pt received from RN and HydrologistN tech in his room after returning from [employer] station.   Pt was somewhat lethargic but recognized therapist and greeted appropriately.   Pt reported plan for bed rest but remained seated at EOB d/t distraction from therapist.   OT noted pt not wearing C-collar and presented pt with challenge/bet (1 dollar) that he could not disassemble collar and reassemble it correctly.   Pt accepted challenge and sustained attention to task for 7 minutes.   Pt was unable to problem-solve or maintain correct placement of pads on device after setup with collar disassembled but displayed no evidence of hostility or frustration and accepted guidance from OT to correctly re-assemble collar.   Pt was then presented with challenge to take "balance test" using Nintendo Wii and balance board.   In controlled environment (door closed) with OT introducing concepts gradually, first by demonstrating adjustment to pt's Mii image and then initiating Wii Fit program, pt compliantly followed directions to perform body awareness and basic balance tests (approx 13 minutes).   Pt rested briefly after standing task and then impulsively exited exit  his room without explanation and was followed by OT.  Pt initiated contact with Marine scientistaministrative assistant and RN regarding request for phone contact with his girlfriend.   Pt made no attempt to elope during session nor was he argumentative with therapist, although demonstrating impulsivity and non-adherence to use of orthotics.   Pt accepted staff explanations for delay with phone contact and returned to his room with supervision.   Pt requested end of session early to retire to bed, approx 13 minutes early.  Pt left in room with RN attending and RN tech present as sitter.  Therapy Documentation Precautions:  Precautions Precautions: Fall, Cervical Required Braces or Orthoses: Cervical Brace, Other Brace/Splint Cervical Brace: Hard collar Other Brace/Splint: L CAM boot for comfort, RUE splint Restrictions Weight Bearing Restrictions: Yes RUE Weight Bearing: Non weight bearing LUE Weight Bearing: Weight bearing as tolerated RLE Weight Bearing: Weight bearing as tolerated LLE Weight Bearing: Weight bearing as tolerated Other Position/Activity Restrictions: in CAM boot  General: General OT Amount of Missed Time: 13 Minutes  ADL: ADL ADL Comments: see functional navigator  See Function Navigator for Current Functional Status.   Therapy/Group: Individual Therapy  Gaynor Genco 07/07/2016, 1:55 PM
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
## 2016-07-08 ENCOUNTER — Inpatient Hospital Stay (HOSPITAL_COMMUNITY): Payer: Medicaid Other

## 2016-07-08 DIAGNOSIS — R4689 Other symptoms and signs involving appearance and behavior: Secondary | ICD-10-CM

## 2016-07-08 NOTE — Progress Notes (Signed)
Occupational Therapy Session Note  Patient Details  Name: Travis Mcguire MRN: [id number] Date of Birth: 12/23/1995  Today's Date: 07/08/2016 OT Individual Time: 1345-1438 OT Individual Time Calculation (min): 53 min   Short Term Goals: Week 4:  OT Short Term Goal 1 (Week 4): Pt will consistently (5/7 sessions) engage/participate in therapy without arguing with therapist OT Short Term Goal 2 (Week 4): Pt will not remove C-collar or CAM boot unless instructed by staff OT Short Term Goal 3 (Week 4): Pt will not attempt to elope from unit during the next reporting period  Skilled Therapeutic Interventions/Progress Updates:   ADL-retraining at shower level with focus on improved compliance to treatment without arguments and reduced agitation resulting in attempted elopement.    Pt received asleep in room requiring 12 minutes of stimulation (change in environment and assist from RN tech) to arouse and attend to conversation.  Pt eventually accepted recommendation for showering and dressing after reorientation to time of day with max multimodal cues to rise from EOB to chair and ambulate from chair to bathroom.   Pt argued with therapist 3 times and relented only after therapist presented as overtly exhausted and exasperated with pt's refusal to attend to his personal hygiene odor.   Pt showered with good thoroughness, slowly, requiring intermittent steadying assist to maintain standing balance.   Pt sat at shower chair to don mesh briefs and dry his feet and then ambulated to edge of bed as late lunch tray arrived.  Pt completed dressing with setup assist and proceeded to eat his lunch with supervision from RN tech.  Treatment session ended as pt was occupied with finishing his lunch.  Therapy Documentation Precautions:  Precautions Precautions: Fall, Cervical Required Braces or Orthoses: Cervical Brace, Other Brace/Splint Cervical Brace: Hard collar Other Brace/Splint: L CAM boot for comfort, RUE  splint Restrictions Weight Bearing Restrictions: Yes RUE Weight Bearing: Non weight bearing LUE Weight Bearing: Weight bearing as tolerated RLE Weight Bearing: Weight bearing as tolerated LLE Weight Bearing: Weight bearing as tolerated Other Position/Activity Restrictions: in CAM boot  Vital Signs: Therapy Vitals Pulse Rate: 97 BP: 123/79   Pain: No/denies pain  ADL: ADL ADL Comments: see functional navigator  See Function Navigator for Current Functional Status.   Therapy/Group: Individual Therapy  Travis Mcguire 07/08/2016, 2:58 PM

## 2016-07-08 NOTE — Progress Notes (Signed)
Patient stated to sitter, "I want to kill myself, I'm going to kill myself right here in front of you." restated suicidal ideation to RN. MD notified. Will continue to give emotional support. Made 3 attempts to leave unit. Haldol PO PRN given, patient sleeping at this time.
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
## 2016-07-09 ENCOUNTER — Inpatient Hospital Stay (HOSPITAL_COMMUNITY): Payer: Medicaid Other | Admitting: Occupational Therapy

## 2016-07-09 ENCOUNTER — Inpatient Hospital Stay (HOSPITAL_COMMUNITY): Payer: Medicaid Other | Admitting: Physical Therapy

## 2016-07-09 ENCOUNTER — Inpatient Hospital Stay (HOSPITAL_COMMUNITY): Payer: Medicaid Other | Admitting: Speech Pathology

## 2016-07-09 MED ORDER — RISPERIDONE 2 MG PO TBDP
2.0000 mg | ORAL_TABLET | Freq: Two times a day (BID) | ORAL | Status: DC
  Administered 2016-07-09 – 2016-07-14 (×10): 2 mg via ORAL
  Filled 2016-07-09 (×12): qty 1

## 2016-07-09 MED ORDER — CLONAZEPAM 0.5 MG PO TABS
0.5000 mg | ORAL_TABLET | Freq: Three times a day (TID) | ORAL | Status: DC
  Administered 2016-07-09 – 2016-07-11 (×6): 0.5 mg via ORAL
  Filled 2016-07-09 (×6): qty 1

## 2016-07-09 NOTE — Progress Notes (Signed)
Speech Language Pathology Daily Session Note  Patient Details  Name: Travis RalphDenzel L XXXShaw MRN: [id number] Date of Birth: Dec 16, 1995  Today's Date: 07/09/2016 SLP Individual Time: 1430-1500 SLP Individual Time Calculation (min): 30 min   Short Term Goals: Week 4: SLP Short Term Goal 1 (Week 4): Patient will request to get back into bed or leave hospital during a 30 minute session no more than 3 times with Mod A question and verbal cues.  SLP Short Term Goal 2 (Week 4): Patient will wear his c-collar for 15 minutes with Mod A verbal cues.  SLP Short Term Goal 3 (Week 4): Patient will demonstrate functional problem solving for basic and functional tasks with Mod A verbal cues. SLP Short Term Goal 4 (Week 4): Patient will demonstrate sustained attention to functional tasks for 15 minutes with Mod A verbal cues for redirection.  SLP Short Term Goal 5 (Week 4): Patient will identify 2 physical and 2 cognitive impairments with Mod A verbal and question cues.   Skilled Therapeutic Interventions: Skilled treatment session focused on cognitive goals. Upon arrival, patient was sitting EOB with girlfriend present. Patient perseverative on taking his girlfriend to work and then going home but was easily redirected by clinician and girlfriend. Patient attended to a mildly complex, visually distracting picture exclusion task with extra time and Mod A verbal cues. Patient attended to task for ~10 minutes with Min A verbal cues for redirection. Overall, patient appeared more socially appropriate with one instance of inappropriate language that was easily redirected. Patient handed off to OT. Continue with current plan of care.    Function:  Eating Eating   Modified Consistency Diet: No Eating Assist Level: More than reasonable amount of time           Cognition Comprehension Comprehension assist level: Understands basic 75 - 89% of the time/ requires cueing 10 - 24% of the time  Expression   Expression  assist level: Expresses basic 90% of the time/requires cueing < 10% of the time.  Social Interaction Social Interaction assist level: Interacts appropriately 50 - 74% of the time - May be physically or verbally inappropriate.  Problem Solving Problem solving assist level: Solves basic 50 - 74% of the time/requires cueing 25 - 49% of the time  Memory Memory assist level: Recognizes or recalls 50 - 74% of the time/requires cueing 25 - 49% of the time    Pain    Therapy/Group: Individual Therapy  Ashraf Mesta 07/09/2016, 3:13 PM

## 2016-07-09 NOTE — Progress Notes (Signed)
Patient agitated in am, but redirected easily, following commands better.   Some positive interactions with RN, said thank you, returned to room when asked the first time, reported card game success with enthusiasm. Less tearful. Occasionally smiled.  Second agitation began at 1630, repeated attempts to leave unit. Redirected multiple times without security, then one call to security.   No PRN medications given today. Reported to oncoming RN.
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
## 2016-07-09 NOTE — Progress Notes (Signed)
Physical Therapy Session Note  Patient Details  Name: Travis Mcguire MRN: [id number] Date of Birth: Oct 21, 1995  Today's Date: 07/09/2016 PT Individual Time: 1130-1205 PT Individual Time Calculation (min): 35 min    Short Term Goals:Week 3:  PT Short Term Goal 1 (Week 3): Pt will wear cervical collar and cam boot >50% of the time with min cuing PT Short Term Goal 1 - Progress (Week 3): Not met PT Short Term Goal 2 (Week 3): Pt will tolerate standing for functional tasks x 10 minutes before fatigue PT Short Term Goal 2 - Progress (Week 3): Not met  Skilled Therapeutic Interventions/Progress Updates:  Pt received in bed asleep and after multiple trials was awakened with multimodal cuing from therapist & RN. Session focused on cognitive remediation, safety awareness & attending to current task. While sitting on EOB pt participated in card matching game with pt successfully completing matches during the first game but with decreased attention to current task and decreased accuracy during second game. During session pt perseverated on calling his mom, walking into hall to ask nurse to make phone call. Pt redirected & returned to his room to continue to participate in card game. At end of session pt left sitting on EOB with sitter present.      Therapy Documentation Precautions:  Precautions Precautions: Fall, Cervical Required Braces or Orthoses: Cervical Brace, Other Brace/Splint Cervical Brace: Hard collar Other Brace/Splint: L CAM boot for comfort, RUE splint Restrictions Weight Bearing Restrictions: Yes RUE Weight Bearing: Non weight bearing (noncomplaint) LUE Weight Bearing: Weight bearing as tolerated (noncomplaint) RLE Weight Bearing: Weight bearing as tolerated (noncomplaint) LLE Weight Bearing: Weight bearing as tolerated (noncomplaint) Other Position/Activity Restrictions: in CAM boot   General: PT Amount of Missed Time (min): 10 Minutes PT Missed Treatment Reason: Patient  fatigue Other Treatments:     See Function Navigator for Current Functional Status.   Therapy/Group: Individual Therapy  Waunita Schooner 07/09/2016, 12:48 PM
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
## 2016-07-10 ENCOUNTER — Inpatient Hospital Stay (HOSPITAL_COMMUNITY): Payer: Medicaid Other | Admitting: Speech Pathology

## 2016-07-10 ENCOUNTER — Inpatient Hospital Stay (HOSPITAL_COMMUNITY): Payer: Self-pay | Admitting: Speech Pathology

## 2016-07-10 ENCOUNTER — Inpatient Hospital Stay (HOSPITAL_COMMUNITY): Payer: Self-pay | Admitting: Occupational Therapy

## 2016-07-10 ENCOUNTER — Inpatient Hospital Stay (HOSPITAL_COMMUNITY): Payer: Self-pay | Admitting: Physical Therapy

## 2016-07-10 ENCOUNTER — Inpatient Hospital Stay (HOSPITAL_COMMUNITY): Payer: Medicaid Other | Admitting: Physical Therapy

## 2016-07-10 NOTE — Progress Notes (Signed)
Speech Language Pathology Daily Session Note  Patient Details  Name: Travis Mcguire MRN: [id number] Date of Birth: 06/13/1996  Today's Date: 07/10/2016 SLP Individual Time: 1030-1100 SLP Individual Time Calculation (min): 30 min   Short Term Goals: Week 4: SLP Short Term Goal 1 (Week 4): Patient will request to get back into bed or leave hospital during a 30 minute session no more than 3 times with Mod A question and verbal cues.  SLP Short Term Goal 2 (Week 4): Patient will wear his c-collar for 15 minutes with Mod A verbal cues.  SLP Short Term Goal 3 (Week 4): Patient will demonstrate functional problem solving for basic and functional tasks with Mod A verbal cues. SLP Short Term Goal 4 (Week 4): Patient will demonstrate sustained attention to functional tasks for 15 minutes with Mod A verbal cues for redirection.  SLP Short Term Goal 5 (Week 4): Patient will identify 2 physical and 2 cognitive impairments with Mod A verbal and question cues.   Skilled Therapeutic Interventions:  Skilled treatment session focused on cognitive goals. Upon arrival, patient talking to a family member on the phone while at the RN station and required encouragement to participate. Patient perseverative on going home and using the phone despite redirection with one instance of crying. Patient independently recalled that the weekend was "bad" but that he had a "good" day yesterday. Patient generated a list of items he needs to accomplish for a day to be considered "good" with Mod A question cues. At end of session, patient reported he was leaving to call his mom but was eventually redirected back to his room and was left supine in bed. Sitter helpful this session. Continue with current plan of care.   Function:  Cognition Comprehension Comprehension assist level: Understands basic 75 - 89% of the time/ requires cueing 10 - 24% of the time  Expression   Expression assist level: Expresses basic 90% of the  time/requires cueing < 10% of the time.  Social Interaction Social Interaction assist level: Interacts appropriately 50 - 74% of the time - May be physically or verbally inappropriate.  Problem Solving Problem solving assist level: Solves basic 50 - 74% of the time/requires cueing 25 - 49% of the time  Memory Memory assist level: Recognizes or recalls 50 - 74% of the time/requires cueing 25 - 49% of the time    Pain No/Denies Pain   Therapy/Group: Individual Therapy  Jnaya Butrick 07/10/2016, 11:25 AM

## 2016-07-10 NOTE — Progress Notes (Signed)
Physical Therapy Note  Patient Details  Name: Travis Mcguire MRN: [id number] Date of Birth: 01-28-1996 Today's Date: 07/10/2016    Time: 1130  Pt sleeping soundly, RN asked PT to avoid waking pt at this time as he has been agitated this morning. Will resume therapy as appropriate.   Arianna Delsanto 07/10/2016, 2:37 PM

## 2016-07-10 NOTE — Progress Notes (Signed)
Occupational Therapy Session Note  Patient Details  Name: Travis Mcguire MRN: [id number] Date of Birth: 01-16-1996  Today's Date: 07/10/2016 OT Individual Time: [id number] OT Individual Time Calculation (min): 30 min     Short Term Goals: Week 4:  OT Short Term Goal 1 (Week 4): Pt will consistently (5/7 sessions) engage/participate in therapy without arguing with therapist OT Short Term Goal 2 (Week 4): Pt will not remove C-collar or CAM boot unless instructed by staff OT Short Term Goal 3 (Week 4): Pt will not attempt to elope from unit during the next reporting period  Skilled Therapeutic Interventions/Progress Updates:   1:1 Pt engaged in self care tasks including showering and brushing teeth. Pt required VC to initiate tasks and carry them out sustaining his attention with mod cuing. Pt communicated verbally at a very slow rate today. Pt showered in standing. Declined wearing c collar/ CAM boot. Dressed with supervision with one occasion of steadying A during one limb stance while threading clothing. Pt left the room twice- perseverating on someone calling his girlfirned about coming up here. Pt able to be redirected with positive reinforcement back to the room to brush his teeth. Pt them returned to front desk to talk with staff - left with sitter and tech. Pt with no verbal or physical escalation today in session.   Therapy Documentation Precautions:  Precautions Precautions: Fall, Cervical Required Braces or Orthoses: Cervical Brace, Other Brace/Splint Cervical Brace: Hard collar Other Brace/Splint: L CAM boot for comfort, RUE splint Restrictions Weight Bearing Restrictions: Yes RUE Weight Bearing: Non weight bearing LUE Weight Bearing: Weight bearing as tolerated RLE Weight Bearing: Weight bearing as tolerated LLE Weight Bearing: Weight bearing as tolerated Other Position/Activity Restrictions: in CAM boot Pain: no c/o/ indications See Function Navigator for Current  Functional Status.   Therapy/Group: Individual Therapy  [name], Annahi Short [facility] 07/10/2016, 11:19 AM

## 2016-07-10 NOTE — Progress Notes (Signed)
SLP Cancellation Note  Patient Details Name: Travis Mcguire MRN: [id number] DOB: January 14, 1996   Cancelled treatment:        Patient missed 30 minutes of skilled SLP services due to being asleep with sitter at the bedside.  Given, that arousing patient has in the past increased restlessness and agitation session defer at this time.                                                                                              Fae PippinMelissa Nazanin Kinner, M.A., CCC-SLP [phone]  [name] 07/10/2016, 1:12 PM

## 2016-07-10 NOTE — Progress Notes (Signed)
Physical Therapy Session Note  Patient Details  Name: Travis Mcguire MRN: [id number] Date of Birth: 31-Mar-1996  Today's Date: 07/10/2016 PT Individual Time: [id number] PT Individual Time Calculation (min): 63 min    Skilled Therapeutic Interventions/Progress Updates:    Pt received sitting on EOB eating lunch. Focused on making today a "good day" with pt unable to state what makes today "good" versus "bad" with therapist educating pt. On one occasion pt became tearful noting he missed his mother; therapist able to redirect pt on making today a good day to work towards going home. Throughout session pt able to solve simple written & verbal math problems correctly 90% of the time without cuing, and minimal cuing to correctly solve theremaining problems. Throughout session pt perseverated on using the phone, ambulating to nurses station multiple times, requiring therapist & nursing staff to assist with redirection. Pt attempted to go behind nurses station and into staff break room with pt becoming agitated when therapist attempted to redirect him with pt stating "I'm not afraid to put my hands on a woman". When ambulating in hallway pt without safety awareness and ambulated between cleaning cart, wall & another pt even with max cuing. At end of session pt able to redirected to room, finished eating lunch, and then returned to bed. Pt left in bed in care of nurse.    Therapy Documentation Precautions:  Precautions Precautions: Fall, Cervical Required Braces or Orthoses: Cervical Brace, Other Brace/Splint Cervical Brace: Hard collar Other Brace/Splint: L CAM boot for comfort, RUE splint Restrictions Weight Bearing Restrictions: Yes RUE Weight Bearing: Non weight bearing LUE Weight Bearing: Weight bearing as tolerated RLE Weight Bearing: Weight bearing as tolerated LLE Weight Bearing: Weight bearing as tolerated Other Position/Activity Restrictions: in CAM boot   See Function Navigator for  Current Functional Status.   Therapy/Group: Individual Therapy  Sandi MariscalVictoria M Miller 07/10/2016, 2:59 PM

## 2016-07-10 NOTE — Progress Notes (Signed)
Patient slept well. Took all of his night and morning meds without complaints. No prns given. Asked what time breakfast tray comes and went back to sleep.

## 2016-07-10 NOTE — Progress Notes (Addendum)
Pt. mildly agitated this AM. Perseverating on phone calls, wandering in halls, no attempts at elopement, although continues to verbalize "I'm leaving".  Able to redirect to room for brief periods of time. Pt c/o headache, napped approx. 2 hours this afternoon.  Calm and cooperative late afternoon into evening. Took all meds today. Sitter present, no visitors this eve.
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
## 2016-07-11 ENCOUNTER — Inpatient Hospital Stay (HOSPITAL_COMMUNITY): Payer: Medicaid Other | Admitting: Speech Pathology

## 2016-07-11 ENCOUNTER — Inpatient Hospital Stay (HOSPITAL_COMMUNITY): Payer: Self-pay | Admitting: Occupational Therapy

## 2016-07-11 ENCOUNTER — Inpatient Hospital Stay (HOSPITAL_COMMUNITY): Payer: Self-pay | Admitting: Physical Therapy

## 2016-07-11 ENCOUNTER — Inpatient Hospital Stay (HOSPITAL_COMMUNITY): Payer: Medicaid Other | Admitting: Physical Therapy

## 2016-07-11 MED ORDER — CLONAZEPAM 0.5 MG PO TABS
0.2500 mg | ORAL_TABLET | Freq: Three times a day (TID) | ORAL | Status: DC
  Administered 2016-07-11 – 2016-07-12 (×3): 0.25 mg via ORAL
  Filled 2016-07-11 (×3): qty 1
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
## 2016-07-11 NOTE — Progress Notes (Signed)
Speech Language Pathology Daily Session Notes  Patient Details  Name: Travis RalphDenzel L XXXShaw MRN: [id number] Date of Birth: April 20, 1996  Today's Date: 07/11/2016   Session 1: SLP Individual Time: 0830-0900 SLP Individual Time Calculation (min): 30 min   Session 2: SLP Individual Time: 1430-1500 SLP Individual Time Calculation (min): 30 min   Short Term Goals: Week 4: SLP Short Term Goal 1 (Week 4): Patient will request to get back into bed or leave hospital during a 30 minute session no more than 3 times with Mod A question and verbal cues.  SLP Short Term Goal 2 (Week 4): Patient will wear his c-collar for 15 minutes with Mod A verbal cues.  SLP Short Term Goal 3 (Week 4): Patient will demonstrate functional problem solving for basic and functional tasks with Mod A verbal cues. SLP Short Term Goal 4 (Week 4): Patient will demonstrate sustained attention to functional tasks for 15 minutes with Mod A verbal cues for redirection.  SLP Short Term Goal 5 (Week 4): Patient will identify 2 physical and 2 cognitive impairments with Mod A verbal and question cues.   Skilled Therapeutic Interventions:  Session 1: Skilled treatment session focused on cognitive goals. Patient independently recalled his current discharge date and organized a calendar appropriately in order to create a visual aid.  Patient requested to make his bed and required Min A verbal and question cues to problem solve and sequence task.  Patient requested to take a shower at end of session and identified items needed to complete successfully and safely with Min A question cues. Patient attended to all tasks with Mod I. Patient did not request to leave or make a phone call and was appropriate throughout session! Patient left with sitter. Continue with current plan of care.   Session 2: Skilled treatment session focused on cognitive goals. Patient re-administered the MoCA (version 7.3) and scored 20/30 points with a score of 26 or above  considered normal. Patient continues to demonstrate impairments in the areas of attention and short-term recall. However, patient's score has improved since last administration on 06/14/16 in which he scored 14/30 points. Results were discussed with patient and his girlfriend. Patient left supine in bed with sitter present. Continue with current plan of care.   Function:   Cognition Comprehension Comprehension assist level: Follows basic conversation/direction with extra time/assistive device  Expression   Expression assist level: Expresses basic needs/ideas: With extra time/assistive device  Social Interaction Social Interaction assist level: Interacts appropriately 90% of the time - Needs monitoring or encouragement for participation or interaction.  Problem Solving Problem solving assist level: Solves basic 75 - 89% of the time/requires cueing 10 - 24% of the time  Memory Memory assist level: Recognizes or recalls 75 - 89% of the time/requires cueing 10 - 24% of the time    Pain Pain in left ankle, RN aware and administered medications  Therapy/Group: Individual Therapy  Bartt Gonzaga 07/11/2016, 10:19 AM

## 2016-07-11 NOTE — Progress Notes (Signed)
Occupational Therapy Note  Patient Details  Name: Jimmey RalphDenzel L XXXShaw MRN: [id number] Date of Birth: Jan 06, 1996  Today's Date: 07/11/2016 OT Individual Time: 1315-1330 OT Individual Time Calculation (min): 15 min  and Today's Date: 07/11/2016 OT Missed Time: 45 Minutes Missed Time Reason: Patient fatigue  Engaged in eating lunch with patient. Engaged in orientation and recall of events of day with daily schedule with min cuing. Pt with increase fatigue and laid down many times to rest.   [name], Yishai Rehfeld [facility] 07/11/2016, 4:40 PM

## 2016-07-11 NOTE — Progress Notes (Addendum)
Physical Therapy Session Note  Patient Details  Name: Jimmey RalphDenzel L XXXShaw MRN: [id number] Date of Birth: [birth year]  Today's Date: 07/11/2016 PT Individual Time: 1550-1615 PT Individual Time Calculation (min): 25 min    Skilled Therapeutic Interventions/Progress Updates:    Treatment 1: Pt missed 60 minutes of skilled PT treatment. RN requested to let pt sleep as he has been trying to leave the unit this AM. Will follow up per plan of care.   Treatment 2: Pt received sitting on EOB with mother & sitter present & agreeable to tx. Pt's mother exited session & therapist provided pt with 3 items to locate in day room. Provided pt with max cuing and strategy of repeating items in order to remember them as pt ambulated to day room. Pt required max cuing to recall 3/3 items, and only cuing to locate 1 item. Pt then able to find 3 additional items with max cuing for memory. Pt engaged in social interaction with LCSW appropriately knocking on her door before entering. Pt returned to bed in room and discussed time of therapy session. Pt required max cuing to problem solve when 30 minute therapy session should end. At end of session pt left in bed with sitter present.   Therapy Documentation Precautions:  Precautions Precautions: Fall, Cervical Required Braces or Orthoses: Cervical Brace, Other Brace/Splint Cervical Brace: Hard collar Other Brace/Splint: L CAM boot for comfort, RUE splint Restrictions Weight Bearing Restrictions: Yes RUE Weight Bearing: Non weight bearing LUE Weight Bearing: Weight bearing as tolerated RLE Weight Bearing: Weight bearing as tolerated LLE Weight Bearing: Weight bearing as tolerated Other Position/Activity Restrictions: in CAM boot   General: PT Amount of Missed Time (min): 60 Minutes PT Missed Treatment Reason: Other (Comment) (RN requesting to let pt sleep)   See Function Navigator for Current Functional Status.   Therapy/Group: Individual Therapy  Sandi MariscalVictoria  M Sylvanus Telford 07/11/2016, 10:47 AM

## 2016-07-11 NOTE — Progress Notes (Signed)
Pt. wandering out to elevators with bags packed, x2 today. Second time directly after GF left.  Easily redirected back to room. Napped at intervals. C/O ankle pain and headache. Oxy 5mg  x2 , effective.

## 2016-07-11 NOTE — Progress Notes (Signed)
Patient asleep most of shift with no agitation, no elopement attempts. Patient took all medications. No complaints of pain. Sitter at bedside.

## 2016-07-12 ENCOUNTER — Inpatient Hospital Stay (HOSPITAL_COMMUNITY): Payer: Self-pay

## 2016-07-12 ENCOUNTER — Inpatient Hospital Stay (HOSPITAL_COMMUNITY): Payer: Self-pay | Admitting: Physical Therapy

## 2016-07-12 ENCOUNTER — Inpatient Hospital Stay (HOSPITAL_COMMUNITY): Payer: Medicaid Other | Admitting: Speech Pathology

## 2016-07-12 LAB — COMPREHENSIVE METABOLIC PANEL
ALT: 13 U/L — ABNORMAL LOW (ref 17–63)
AST: 18 U/L (ref 15–41)
Albumin: 3.2 g/dL — ABNORMAL LOW (ref 3.5–5.0)
Alkaline Phosphatase: 84 U/L (ref 38–126)
Anion gap: 10 (ref 5–15)
BUN: 13 mg/dL (ref 6–20)
CHLORIDE: 103 mmol/L (ref 101–111)
CO2: 27 mmol/L (ref 22–32)
Calcium: 9.2 mg/dL (ref 8.9–10.3)
Creatinine, Ser: 0.86 mg/dL (ref 0.61–1.24)
Glucose, Bld: 90 mg/dL (ref 65–99)
POTASSIUM: 4 mmol/L (ref 3.5–5.1)
SODIUM: 140 mmol/L (ref 135–145)
Total Bilirubin: 0.2 mg/dL — ABNORMAL LOW (ref 0.3–1.2)
Total Protein: 6.1 g/dL — ABNORMAL LOW (ref 6.5–8.1)

## 2016-07-12 LAB — CBC
HCT: 35.1 % — ABNORMAL LOW (ref 39.0–52.0)
Hemoglobin: 11.2 g/dL — ABNORMAL LOW (ref 13.0–17.0)
MCH: 32.5 pg (ref 26.0–34.0)
MCHC: 31.9 g/dL (ref 30.0–36.0)
MCV: 101.7 fL — AB (ref 78.0–100.0)
PLATELETS: 197 10*3/uL (ref 150–400)
RBC: 3.45 MIL/uL — ABNORMAL LOW (ref 4.22–5.81)
RDW: 13.8 % (ref 11.5–15.5)
WBC: 5.2 10*3/uL (ref 4.0–10.5)

## 2016-07-12 LAB — VALPROIC ACID LEVEL: Valproic Acid Lvl: 79 ug/mL (ref 50.0–100.0)

## 2016-07-12 MED ORDER — QUETIAPINE FUMARATE 100 MG PO TABS
100.0000 mg | ORAL_TABLET | Freq: Every day | ORAL | Status: DC
  Administered 2016-07-12 – 2016-07-13 (×2): 100 mg
  Filled 2016-07-12 (×2): qty 1

## 2016-07-12 NOTE — Progress Notes (Addendum)
Occupational Therapy Note  Patient Details  Name: Travis Mcguire MRN: [id number] Date of Birth: 10-18-1995  Today's Date: 07/12/2016 OT Individual Time: 1300-1400 OT Individual Time Calculation (min): 60 min    Pt denied pain Individual therapy  Pt initially engaged in gathering soiled clothing and taking to patient laundry.  Pt insistent on removing all clothing and talking to washer.  Pt agreed to leave his clothing on after extended discussion.  Pt returned to room and reviewed schedule and plan for tomorrow.  Pt recalled his discharge on Saturday.  Pt continues to be lethargic and requires min to max verbal cues to actively participate in session. Per pt report, pt completed bathing/dressing tasks earlier; verified by nursing staff.   Lavone Neri [facility] 07/12/2016, 3:09 PM

## 2016-07-12 NOTE — Progress Notes (Signed)
Social Work Patient ID: Travis Mcguire, male   DOB: 1996-01-10, 20 y.o.   MRN: [id number]   Have reviewed team conference with pt's mother, [name] and she is agreeable with plan for d/c on Sat 10/7.  Have stressed to her that he will need to continue with medication management for behavior control.  Have planned for her to be here Friday afternoon and will review safety plans.  She understands that his current state of behavior and cognition are not expected to change with any further CIR stay.  Will make community referrals for any additional support we can add to this difficult situation.    [name], LCSW

## 2016-07-12 NOTE — Progress Notes (Signed)
Speech Language Pathology Daily Session Note  Patient Details  Name: Travis Mcguire MRN: [id number] Date of Birth: 07/21/1996  Today's Date: 07/12/2016 SLP Individual Time: 1430-1525 SLP Individual Time Calculation (min): 55 min   Short Term Goals: Week 4: SLP Short Term Goal 1 (Week 4): Patient will request to get back into bed or leave hospital during a 30 minute session no more than 3 times with Mod A question and verbal cues.  SLP Short Term Goal 2 (Week 4): Patient will wear his c-collar for 15 minutes with Mod A verbal cues.  SLP Short Term Goal 3 (Week 4): Patient will demonstrate functional problem solving for basic and functional tasks with Mod A verbal cues. SLP Short Term Goal 4 (Week 4): Patient will demonstrate sustained attention to functional tasks for 15 minutes with Mod A verbal cues for redirection.  SLP Short Term Goal 5 (Week 4): Patient will identify 2 physical and 2 cognitive impairments with Mod A verbal and question cues.   Skilled Therapeutic Interventions: Skilled treatment session focused on cognitive goals. SLP facilitated session by providing Max-Total A for anticipatory awareness for d/c planning in regards to generating a list of activities he should and should not due while at home. Patient able to verbalize intellectual awareness of impairments with Min A question cues but could not demonstrate anticipatory awareness. Patient's girlfriend present to reinforce information. Patient demonstrated selective attention to conversation/list making with Mod A verbal cues for redirection for ~30 minutes. Patient independently asked to put clothes in dryer at beginning of session and to remove them at the end of the session. Patient completed task with Mod I. Patient left supine in bed with sitter present. Continue with current plan of care.   Function:  Cognition Comprehension Comprehension assist level: Follows complex conversation/direction with extra time/assistive  device  Expression   Expression assist level: Expresses complex ideas: With extra time/assistive device  Social Interaction Social Interaction assist level: Interacts appropriately 75 - 89% of the time - Needs redirection for appropriate language or to initiate interaction.  Problem Solving Problem solving assist level: Solves basic 75 - 89% of the time/requires cueing 10 - 24% of the time  Memory Memory assist level: Recognizes or recalls 50 - 74% of the time/requires cueing 25 - 49% of the time    Pain No reports of pain   Therapy/Group: Individual Therapy  Alyn Riedinger 07/12/2016, 4:27 PM

## 2016-07-12 NOTE — Progress Notes (Addendum)
Physical Therapy Note  Patient Details  Name: Jimmey RalphDenzel L XXXShaw MRN: [id number] Date of Birth: July 23, 1996 Today's Date: 07/12/2016    Time: 730-827 57 minutes  1:1 no c/o pain. Pt requires max cuing to wake up and min encouragement to participate.  Pt able to perform all mobility independently.  Gait with mod I and stair negotiation with mod I with 1 railing.  Pt able to stand to balance independently, pt requires mod cuing to search and find clothing items in his room. Pt participated in conversation regarding d/c planning with max cuing for safety awareness.  Pt still with limited awareness of deficits and decreased memory noticeable throughout session.   Time: 1130 Pt refused 30 minute PT session due to pretending to be asleep, refuses to respond to PT despite opening his eyes.  Tamico Mundo 07/12/2016, 8:29 AM

## 2016-07-12 NOTE — Plan of Care (Signed)
Problem: RH SAFETY Goal: RH STG ADHERE TO SAFETY PRECAUTIONS W/ASSISTANCE/DEVICE STG Adhere to Safety Precautions With max Assistance/Device.  Outcome: Progressing Patient is calm and resting at this moment ; safety sitter in room

## 2016-07-12 NOTE — Discharge Instructions (Signed)
Inpatient Rehab Discharge Instructions  Jaskaran Chauncy PassyL XXXShaw Discharge date and time: No discharge date for patient encounter.   Activities/Precautions/ Functional Status: Activity: activity as tolerated Diet: regular diet Wound Care: none needed Functional status:  ___ No restrictions     ___ Walk up steps independently ___ 24/7 supervision/assistance   ___ Walk up steps with assistance ___ Intermittent supervision/assistance  ___ Bathe/dress independently ___ Walk with walker     _x__ Bathe/dress with assistance ___ Walk Independently    ___ Shower independently ___ Walk with assistance    ___ Shower with assistance ___ No alcohol     ___ Return to work/school ________    COMMUNITY REFERRALS UPON DISCHARGE:    Home Health:   PT     OT     ST                     Agency:  [facility] Phone: [phone]     GENERAL COMMUNITY RESOURCES FOR PATIENT/FAMILY:  Support Groups: [place] [facility]    Mobile Crisis Unit 24/7 @ [phone]       Special Instructions:  24 hour supervision for safety  My questions have been answered and I understand these instructions. I will adhere to these goals and the provided educational materials after my discharge from the hospital.  Patient/Caregiver Signature _______________________________ Date __________  Clinician Signature _______________________________________ Date __________  Please bring this form and your medication list with you to all your follow-up doctor's appointments. FOR SAFETY, 24 HOUR SUPERVISION IS RECOMMENDED UNTIL OTHERWISE DIRECTED BY A PHYSICIAN.  ABSOLUTELY NO DRIVING UNTIL OTHERWISE DIRECTED BY A PHYSICIAN. CAR KEYS SHOULD BE HIDDEN IN A SAFE LOCATION IF NEEDED.  DUE TO RISK OF INJURY, PLEASE REMOVE GUNS, KNIVES, OR ANY OTHER POTENTIALLY DANGEROUS OBJECTS AND HAZARDOUS MATERIALS FROM THE HOME. FOR SAFETY, 24 HOUR SUPERVISION IS RECOMMENDED UNTIL OTHERWISE DIRECTED BY A  PHYSICIAN.  ABSOLUTELY NO DRIVING UNTIL OTHERWISE DIRECTED BY A PHYSICIAN. CAR KEYS SHOULD BE HIDDEN IN A SAFE LOCATION IF NEEDED.  DUE TO RISK OF INJURY, PLEASE REMOVE GUNS, KNIVES, OR ANY OTHER POTENTIALLY DANGEROUS OBJECTS AND HAZARDOUS MATERIALS FROM THE HOME.
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
## 2016-07-13 ENCOUNTER — Inpatient Hospital Stay (HOSPITAL_COMMUNITY): Payer: Self-pay | Admitting: Physical Therapy

## 2016-07-13 ENCOUNTER — Inpatient Hospital Stay (HOSPITAL_COMMUNITY): Payer: Self-pay

## 2016-07-13 ENCOUNTER — Inpatient Hospital Stay (HOSPITAL_COMMUNITY): Payer: Medicaid Other | Admitting: Speech Pathology

## 2016-07-13 MED ORDER — RISPERIDONE 2 MG PO TBDP: 2.0000 mg | ORAL_TABLET | Freq: Two times a day (BID) | ORAL | 1 refills | Status: DC

## 2016-07-13 MED ORDER — QUETIAPINE FUMARATE 100 MG PO TABS
100.0000 mg | ORAL_TABLET | Freq: Every day | ORAL | 1 refills | Status: DC
Start: 2016-07-13 — End: 2016-08-29

## 2016-07-13 MED ORDER — DIVALPROEX SODIUM 500 MG PO DR TAB: 500.0000 mg | DELAYED_RELEASE_TABLET | Freq: Three times a day (TID) | ORAL | 1 refills | Status: DC

## 2016-07-13 MED ORDER — CITALOPRAM HYDROBROMIDE 20 MG PO TABS: 20.0000 mg | ORAL_TABLET | Freq: Every day | ORAL | 1 refills | Status: DC

## 2016-07-13 MED ORDER — PROPRANOLOL HCL 20 MG PO TABS: 20.0000 mg | ORAL_TABLET | Freq: Three times a day (TID) | ORAL | 1 refills | Status: DC

## 2016-07-13 MED ORDER — TRAMADOL HCL 50 MG PO TABS: 50.0000 mg | ORAL_TABLET | Freq: Four times a day (QID) | ORAL | 0 refills | Status: DC | PRN

## 2016-07-13 MED ORDER — LORAZEPAM 1 MG PO TABS: 1.0000 mg | ORAL_TABLET | Freq: Four times a day (QID) | ORAL | 0 refills | Status: DC | PRN

## 2016-07-13 NOTE — Progress Notes (Signed)
Physical Therapy Discharge Summary  Patient Details  Name: Travis Mcguire MRN: [id number] Date of Birth: Apr 22, 1996   Patient has met 11 of 11 long term goals due to improved activity tolerance, improved balance, increased strength, decreased pain, ability to compensate for deficits, improved attention, improved awareness and improved coordination.  Patient to discharge at an ambulatory level Independent with mobility, will need supervision dues to behavior and cognitive deficits.   Patient's care partner has been made aware of pt's cognitive and behavioral deficits and understands that 24 hour supervision is recommended at discharge.  Reasons goals not met: n/a  Recommendation:  Patient will benefit from ongoing skilled PT services in home health setting to continue to advance safe functional mobility, address ongoing impairments in activity tolerance, balance, awareness, attention, and minimize fall risk.  Equipment: No equipment provided  Reasons for discharge: treatment goals met and discharge from hospital  Patient/family agrees with progress made and goals achieved: Yes  PT Discharge Precautions/Restrictions Precautions Precautions: Fall;Cervical Required Braces or Orthoses: Cervical Brace Cervical Brace: Hard collar Other Brace/Splint: L CAM boot for comfort Restrictions RUE Weight Bearing: Non weight bearing LUE Weight Bearing: Weight bearing as tolerated RLE Weight Bearing: Weight bearing as tolerated LLE Weight Bearing: Weight bearing as tolerated Pain Pain Assessment Pain Assessment: No/denies pain V Cognition Overall Cognitive Status: Impaired/Different from baseline Arousal/Alertness: Awake/alert Orientation Level: Oriented to person;Oriented to place Attention: Selective Memory: Impaired Awareness: Impaired Awareness Impairment: Emergent impairment Safety/Judgment: Impaired Rancho [employer] Scales of Cognitive Functioning:  Automatic/appropriate Sensation Sensation Light Touch: Appears Intact Proprioception: Appears Intact Coordination Gross Motor Movements are Fluid and Coordinated: Yes Motor  Motor Motor: Within Functional Limits   Trunk/Postural Assessment  Cervical Assessment Cervical Assessment:  (cervical collar) Thoracic Assessment Thoracic Assessment: Within Functional Limits Lumbar Assessment Lumbar Assessment: Within Functional Limits Postural Control Postural Control: Within Functional Limits  Balance Dynamic Sitting Balance Dynamic Sitting - Level of Assistance: 7: Independent Static Standing Balance Static Standing - Level of Assistance: 7: Independent Dynamic Standing Balance Dynamic Standing - Level of Assistance: 7: Independent Extremity Assessment      RLE Assessment RLE Assessment: Within Functional Limits LLE Strength LLE Overall Strength Comments: WFL hip and knee, ankle limited by pain   See Function Navigator for Current Functional Status.  [name] 07/13/2016, 9:43 AM

## 2016-07-13 NOTE — Progress Notes (Signed)
Speech Language Pathology Session Note & Discharge Summary  Patient Details  Name: Travis Mcguire MRN: [id number] Date of Birth: 08/16/1996  Today's Date: 07/13/2016 SLP Individual Time: 1300-1330 SLP Individual Time Calculation (min): 30 minToday's Date: 07/13/2016 SLP Missed Time: 30 Minutes Missed Time Reason: Patient fatigue   Skilled Therapeutic Interventions:  Skilled treatment session focused on cognitive goals. SLP facilitated session by providing a handout of memory strategies. Patient read strategies aloud and verbalized how to incorporate strategies at home with overall Mod A question and verbal cues. Patient reported fatigue and lethargy, therefore, session ended 30 minutes early. Patient left supine in bed with all needs within reach.   Of note, SLP, CSW and PA met with patient's mother to provide education in regards to his current cognitive function and strategies to utilize at home to maximize his overall safety and independence. She was also educated on the importance of 24 hour supervision from family to ensure safety and safe decision making. She verbalized understanding of all information and handouts were also provided.    Patient has met 5 of 6 long term goals.  Patient to discharge at overall Min;Mod level.   Reasons goals not met: Patient continues to require overall Mod A for recall of functional information.    Clinical Impression/Discharge Summary:  Patient has made functional gains and has met 5 of 6 LTG's this admission due to improved cognitive and swallowing function. Currently, patient is demonstrating behaviors consistent with a Rancho Level VII and requires overall Min A to complete functional and familiar tasks safely in regards to problem solving, attention and intellectual awareness and Mod A for recall of functional information. Patient also demonstrates improved social interaction with decreased agitation. Patient is currently consuming regular textures  with thin liquids without overt s/s of aspiration. Patient and family education is complete and patient will discharge home with 24 hour supervision from family.  Patient would benefit from f/u SLP services to maximize his cognitive function and overall functional independence in order to reduce caregiver burden.   Care Partner:  Caregiver Able to Provide Assistance: Yes  Type of Caregiver Assistance: Cognitive  Recommendation:  24 hour supervision/assistance;Home Health SLP  Rationale for SLP Follow Up: Reduce caregiver burden;Maximize cognitive function and independence   Equipment: N/A   Reasons for discharge: Discharged from hospital   Patient/Family Agrees with Progress Made and Goals Achieved: Yes   Function:  Cognition Comprehension Comprehension assist level: Understands complex 90% of the time/cues 10% of the time  Expression   Expression assist level: Expresses complex 90% of the time/cues < 10% of the time  Social Interaction Social Interaction assist level: Interacts appropriately 75 - 89% of the time - Needs redirection for appropriate language or to initiate interaction.  Problem Solving Problem solving assist level: Solves basic 75 - 89% of the time/requires cueing 10 - 24% of the time  Memory Memory assist level: Recognizes or recalls 50 - 74% of the time/requires cueing 25 - 49% of the time   ,  07/13/2016, 4:48 PM    

## 2016-07-13 NOTE — Progress Notes (Signed)
Physical Therapy Note  Patient Details  Name: Travis Mcguire MRN: [id number] Date of Birth: 1996-07-30 Today's Date: 07/13/2016    Time: 930  Pt refused 60 minute PT session due to "I want to sleep". Pt states he plans to "sleep until I go home"   [facility] Veterans HospitalDONAWERTH,Florencio Hollibaugh 07/13/2016, 9:38 AM

## 2016-07-13 NOTE — Progress Notes (Signed)
Occupational Therapy Discharge Summary  Patient Details  Name: Travis Mcguire MRN: [id number] Date of Birth: 05-Aug-1996  Patient has met 8 of 11 long term goals due to improved activity tolerance, improved balance, postural control, ability to compensate for deficits, improved attention and improved awareness. Pt made gains with BADLs during this admission but continues to require mod/max verbal cues for emergent awareness, recall of new information, orientation, and safety awareness.  Pt completes all BADLs without assistance but will require 24 hour supervision after discharge.  Pt's mom has verbalized understanding of recommendation.  Pt exhibits behaviors consistent with Rancho Level VII. Patient to discharge at overall Supervision level.  Patient's care partner is independent to provide the necessary physical and cognitive assistance at discharge.    Reasons goals not met: Pt still requires min to mod A for congition including awareness, attention and daily recall- requiring 24 hr supervision.   Recommendation:  Patient will benefit from ongoing skilled OT services in home health setting to continue to advance functional skills in the area of BADL, Vocation and Reduce care partner burden.  Equipment: No equipment provided  Reasons for discharge: discharge from hospital  Patient/family agrees with progress made and goals achieved: Yes  OT Discharge Vision/Perception  Vision- History Baseline Vision/History: No visual deficits Patient Visual Report: No change from baseline  Cognition Overall Cognitive Status: Impaired/Different from baseline Arousal/Alertness: Awake/alert Orientation Level: Oriented to person;Oriented to place;Disoriented to time Attention: Selective Sustained Attention: Impaired Sustained Attention Impairment: Verbal basic;Functional basic Memory: Impaired Memory Impairment: Decreased recall of new information;Storage deficit;Decreased short term  memory Decreased Short Term Memory: Verbal basic;Functional basic Awareness: Impaired Awareness Impairment: Emergent impairment Problem Solving: Impaired Problem Solving Impairment: Verbal basic;Functional basic Safety/Judgment: Impaired [place] [employer] Scales of Cognitive Functioning: Automatic/appropriate Sensation Sensation Light Touch: Appears Intact Stereognosis: Appears Intact Hot/Cold: Appears Intact Proprioception: Appears Intact Coordination Gross Motor Movements are Fluid and Coordinated: Yes Fine Motor Movements are Fluid and Coordinated: Yes Motor  Motor Motor: Within Functional Limits    Trunk/Postural Assessment  Cervical Assessment Cervical Assessment: Within Functional Limits Thoracic Assessment Thoracic Assessment: Within Functional Limits Lumbar Assessment Lumbar Assessment: Within Functional Limits Postural Control Postural Control: Within Functional Limits  Balance Dynamic Sitting Balance Dynamic Sitting - Level of Assistance: 7: Independent Static Standing Balance Static Standing - Level of Assistance: 7: Independent Dynamic Standing Balance Dynamic Standing - Level of Assistance: 7: Independent Extremity/Trunk Assessment RUE Assessment RUE Assessment: Within Functional Limits LUE Assessment LUE Assessment: Within Functional Limits   See Function Navigator for Current Functional Status.  [name] [facility] 07/13/2016, 12:02 PM
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
## 2016-07-13 NOTE — Progress Notes (Signed)
Occupational Therapy Session Note  Patient Details  Name: Travis RalphDenzel L XXXShaw MRN: [id number] Date of Birth: 1996/04/10  Today's Date: 07/13/2016 OT Individual Time: 0800-0830 OT Individual Time Calculation (min): 30 min  and Today's Date: 07/13/2016 OT Missed Time: 30 Minutes Missed Time Reason: Patient fatigue;Patient unwilling/refused to participate without medical reason     Short Term Goals: Week 4:  OT Short Term Goal 1 (Week 4): Pt will consistently (5/7 sessions) engage/participate in therapy without arguing with therapist OT Short Term Goal 2 (Week 4): Pt will not remove C-collar or CAM boot unless instructed by staff OT Short Term Goal 3 (Week 4): Pt will not attempt to elope from unit during the next reporting period  Skilled Therapeutic Interventions/Progress Updates:    Pt resting in bed upon arrival with sitter present.  Pt required min multimodal cues for arousal.  Attempted to engage patient in self care activities.  Pt stated he had showered the night before and didn't want to do anything else but sleep until tomorrow.  Continued discharge planning and education.  Pt stated he was going to do what he wanted to do.  Advised patient to follow discharge instructions and safety precautions.  Therapy Documentation Precautions:  Precautions Precautions: Fall, Cervical Required Braces or Orthoses: Cervical Brace, Other Brace/Splint Cervical Brace: Hard collar Other Brace/Splint: L CAM boot for comfort, RUE splint Restrictions Weight Bearing Restrictions: Yes RUE Weight Bearing: Non weight bearing LUE Weight Bearing: Weight bearing as tolerated RLE Weight Bearing: Weight bearing as tolerated LLE Weight Bearing: Weight bearing as tolerated Other Position/Activity Restrictions: in CAM boot General: General OT Amount of Missed Time: 30 Minutes Pain:  Pt denied pain ADL: ADL ADL Comments: see functional navigator Exercises:   Other Treatments:    See Function Navigator  for Current Functional Status.   Therapy/Group: Individual Therapy  [name], Hillary Schwegler Chappell 07/13/2016, 8:55 AM

## 2016-07-13 NOTE — Discharge Summary (Signed)
Discharge summary job [id number]
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
## 2016-07-14 NOTE — Progress Notes (Signed)
Patient's mother [name] received discharge instructions from PA yesterday; she denies further questions.  [name] given patient's belongings.  Patient escorted by myself via wheelchair to mother's vehicle for discharge.
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
## 2016-07-16 ENCOUNTER — Telehealth: Payer: Self-pay

## 2016-07-16 NOTE — Progress Notes (Signed)
Social Work  Discharge Note  The overall goal for the admission was met for:   Discharge location: Yes - home with mother providing 24/7 supervision  Length of Stay: No - LOS longer than anticipated due to behavioral management issues  Discharge activity level: Yes - supervision  Home/community participation: Yes  Services provided included: MD, RD, PT, OT, SLP, RN, TR, Pharmacy and SW and psychiatry  Financial Services: Medicaid - application pending along with SSD app  Follow-up services arranged: Home Health: PT, OT, ST via [facility] and Patient/Family has no preference for HH/DME agencies  Comments (or additional information):  PA, ST and I met with pt's mother yesterday afternoon to review all d/c instructions and final education for behavioral management strategies in the home.  Provided mother with several handouts as well.  Mother admits she is a little nervous about bring patient home but feels prepared to do so and stated some strategies she already had in mind.  Mother provided with emergency services she can access if needed as well.    Patient/Family verbalized understanding of follow-up arrangements: Yes  Individual responsible for coordination of the follow-up plan: mother  Confirmed correct DME delivered: NA Tanai Bouler

## 2016-07-16 NOTE — Telephone Encounter (Signed)
Message received from KerseyLucy Hoyle, [place] requesting a [facility]. She noted that is mother  - Nathanial Millmanngela Junious # [phone] can be contacted to schedule the appointment.  [facility] scheduled for 07/17/16 @1130 .  Update provided to L. ParsonsHoyle, SW.

## 2016-07-17 ENCOUNTER — Ambulatory Visit: Payer: Medicaid Other | Attending: Family Medicine | Admitting: Family Medicine

## 2016-07-17 ENCOUNTER — Encounter: Payer: Self-pay | Admitting: Family Medicine

## 2016-07-17 VITALS — BP 106/67 | HR 80 | Temp 98.8°F | Ht 71.0 in | Wt 149.0 lb

## 2016-07-17 DIAGNOSIS — R208 Other disturbances of skin sensation: Secondary | ICD-10-CM | POA: Diagnosis not present

## 2016-07-17 DIAGNOSIS — S06361D Traumatic hemorrhage of cerebrum, unspecified, with loss of consciousness of 30 minutes or less, subsequent encounter: Secondary | ICD-10-CM | POA: Insufficient documentation

## 2016-07-17 DIAGNOSIS — S0292XD Unspecified fracture of facial bones, subsequent encounter for fracture with routine healing: Secondary | ICD-10-CM | POA: Diagnosis not present

## 2016-07-17 DIAGNOSIS — R200 Anesthesia of skin: Secondary | ICD-10-CM

## 2016-07-17 DIAGNOSIS — S0292XS Unspecified fracture of facial bones, sequela: Secondary | ICD-10-CM

## 2016-07-17 DIAGNOSIS — S12600D Unspecified displaced fracture of seventh cervical vertebra, subsequent encounter for fracture with routine healing: Secondary | ICD-10-CM | POA: Insufficient documentation

## 2016-07-17 DIAGNOSIS — X58XXXD Exposure to other specified factors, subsequent encounter: Secondary | ICD-10-CM | POA: Insufficient documentation

## 2016-07-17 DIAGNOSIS — R413 Other amnesia: Secondary | ICD-10-CM | POA: Diagnosis not present

## 2016-07-17 DIAGNOSIS — S06361A Traumatic hemorrhage of cerebrum, unspecified, with loss of consciousness of 30 minutes or less, initial encounter: Secondary | ICD-10-CM
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
## 2016-07-20 ENCOUNTER — Telehealth: Payer: Self-pay | Admitting: *Deleted

## 2016-07-20 NOTE — Telephone Encounter (Signed)
[facility]anda, [facility] to report that they have been unable to establish care with the patient.......noted

## 2016-08-08 DIAGNOSIS — S52001D Unspecified fracture of upper end of right ulna, subsequent encounter for closed fracture with routine healing: Secondary | ICD-10-CM

## 2016-08-08 DIAGNOSIS — S065X0D Traumatic subdural hemorrhage without loss of consciousness, subsequent encounter: Secondary | ICD-10-CM

## 2016-08-08 DIAGNOSIS — S82402D Unspecified fracture of shaft of left fibula, subsequent encounter for closed fracture with routine healing: Secondary | ICD-10-CM

## 2016-08-08 DIAGNOSIS — S12601D Unspecified nondisplaced fracture of seventh cervical vertebra, subsequent encounter for fracture with routine healing: Secondary | ICD-10-CM

## 2016-08-29 ENCOUNTER — Encounter: Payer: Self-pay | Admitting: Physical Medicine & Rehabilitation

## 2016-08-29 ENCOUNTER — Encounter: Payer: Medicaid Other | Attending: Physical Medicine & Rehabilitation | Admitting: Physical Medicine & Rehabilitation

## 2016-08-29 VITALS — BP 114/57 | HR 83

## 2016-08-29 DIAGNOSIS — S069X3S Unspecified intracranial injury with loss of consciousness of 1 hour to 5 hours 59 minutes, sequela: Secondary | ICD-10-CM

## 2016-08-29 DIAGNOSIS — S52023S Displaced fracture of olecranon process without intraarticular extension of unspecified ulna, sequela: Secondary | ICD-10-CM

## 2016-08-29 DIAGNOSIS — Z5189 Encounter for other specified aftercare: Secondary | ICD-10-CM | POA: Insufficient documentation

## 2016-08-29 DIAGNOSIS — S0219XA Other fracture of base of skull, initial encounter for closed fracture: Secondary | ICD-10-CM | POA: Diagnosis not present

## 2016-08-29 DIAGNOSIS — X58XXXA Exposure to other specified factors, initial encounter: Secondary | ICD-10-CM | POA: Insufficient documentation

## 2016-08-29 DIAGNOSIS — S12691S Other nondisplaced fracture of seventh cervical vertebra, sequela: Secondary | ICD-10-CM

## 2016-08-29 DIAGNOSIS — S82465S Nondisplaced segmental fracture of shaft of left fibula, sequela: Secondary | ICD-10-CM

## 2016-08-29 MED ORDER — DIVALPROEX SODIUM 500 MG PO DR TAB: 500.0000 mg | DELAYED_RELEASE_TABLET | Freq: Two times a day (BID) | ORAL | 1 refills | Status: DC
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
## 2016-08-29 NOTE — Patient Instructions (Signed)
PLEASE CALL ME WITH ANY PROBLEMS OR QUESTIONS ([phone])   HAPPY THANKSGIVING!!!!    

## 2016-09-03 ENCOUNTER — Telehealth: Payer: Self-pay | Admitting: *Deleted

## 2016-09-03 NOTE — Telephone Encounter (Signed)
Received a call from someone identifying as a girlfriend of Mr Travis Mcguire calling about some return to work papers.  There is no one listed on DPR other than mother so this call will not be addressed.

## 2016-09-04 ENCOUNTER — Telehealth: Payer: Self-pay | Admitting: Physical Medicine & Rehabilitation

## 2016-09-04 NOTE — Telephone Encounter (Signed)
REcd' return to work document for ZS to review and sign - to inbox.

## 2016-10-29 ENCOUNTER — Encounter: Payer: Medicaid Other | Attending: Physical Medicine & Rehabilitation | Admitting: Physical Medicine & Rehabilitation

## 2016-10-29 DIAGNOSIS — S0219XA Other fracture of base of skull, initial encounter for closed fracture: Secondary | ICD-10-CM | POA: Insufficient documentation

## 2016-10-29 DIAGNOSIS — Z5189 Encounter for other specified aftercare: Secondary | ICD-10-CM | POA: Insufficient documentation

## 2016-11-16 ENCOUNTER — Encounter (HOSPITAL_COMMUNITY): Payer: Self-pay

## 2016-11-16 ENCOUNTER — Emergency Department (HOSPITAL_COMMUNITY)
Admission: EM | Admit: 2016-11-16 | Discharge: 2016-11-16 | Disposition: A | Discharge status: Home / Self Care | Payer: Medicaid Other | Attending: Emergency Medicine | Admitting: Emergency Medicine

## 2016-11-16 DIAGNOSIS — M542 Cervicalgia: Secondary | ICD-10-CM | POA: Diagnosis present

## 2016-11-16 DIAGNOSIS — R1110 Vomiting, unspecified: Secondary | ICD-10-CM | POA: Diagnosis not present

## 2016-11-16 DIAGNOSIS — Z5321 Procedure and treatment not carried out due to patient leaving prior to being seen by health care provider: Secondary | ICD-10-CM | POA: Insufficient documentation

## 2016-11-16 DIAGNOSIS — M549 Dorsalgia, unspecified: Secondary | ICD-10-CM | POA: Diagnosis not present

## 2016-11-16 NOTE — ED Provider Notes (Signed)
Patient decided to leave before  evaluation.   Mancel BaleElliott Skylor Schnapp, MD 11/16/16 [phone]

## 2016-11-16 NOTE — ED Notes (Signed)
Pt no longer in room upon rounding at this time

## 2016-11-16 NOTE — ED Notes (Signed)
Bed: WA07 Expected date:  Expected time:  Means of arrival:  Comments: 
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
## 2017-03-07 ENCOUNTER — Emergency Department (HOSPITAL_COMMUNITY)
Admission: EM | Admit: 2017-03-07 | Discharge: 2017-03-08 | Discharge status: Against Medical Advice | Payer: Medicaid Other

## 2017-03-07 NOTE — ED Notes (Signed)
Pt called for v/s, no response from lobby 
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
## 2017-03-07 NOTE — ED Notes (Signed)
Bed: WLPT1 Expected date:  Expected time:  Means of arrival:  Comments: 

## 2018-02-04 ENCOUNTER — Encounter (HOSPITAL_COMMUNITY): Payer: Self-pay | Admitting: Emergency Medicine

## 2018-02-04 ENCOUNTER — Emergency Department (HOSPITAL_COMMUNITY)
Admission: EM | Admit: 2018-02-04 | Discharge: 2018-02-04 | Disposition: A | Discharge status: Home / Self Care | Payer: Medicaid Other | Attending: Emergency Medicine | Admitting: Emergency Medicine

## 2018-02-04 DIAGNOSIS — R300 Dysuria: Secondary | ICD-10-CM | POA: Diagnosis present

## 2018-02-04 DIAGNOSIS — F17210 Nicotine dependence, cigarettes, uncomplicated: Secondary | ICD-10-CM | POA: Diagnosis not present

## 2018-02-04 DIAGNOSIS — R369 Urethral discharge, unspecified: Secondary | ICD-10-CM | POA: Insufficient documentation

## 2018-02-04 MED ORDER — AZITHROMYCIN 250 MG PO TABS
1000.0000 mg | ORAL_TABLET | Freq: Once | ORAL | Status: AC
Start: 2018-02-04 — End: 2018-02-04
  Administered 2018-02-04: 1000 mg via ORAL
  Filled 2018-02-04: qty 4

## 2018-02-04 MED ORDER — LIDOCAINE HCL 1 % IJ SOLN
INTRAMUSCULAR | Status: AC
  Administered 2018-02-04: 1.5 mL
  Filled 2018-02-04: qty 20

## 2018-02-04 MED ORDER — CEFTRIAXONE SODIUM 250 MG IJ SOLR
250.0000 mg | Freq: Once | INTRAMUSCULAR | Status: AC
  Administered 2018-02-04: 250 mg via INTRAMUSCULAR
  Filled 2018-02-04: qty 250

## 2018-02-04 NOTE — Discharge Instructions (Addendum)
Follow up with the health department for additional STD screening. No sex for 2 weeks after treatment. Contact your sex partners if your cultures are positive so they can be treated.
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
## 2018-02-04 NOTE — ED Triage Notes (Addendum)
Patient c/o dysuria and white penile discharge x2 days. Hx STD. Denies pain.

## 2018-02-05 LAB — HIV ANTIBODY (ROUTINE TESTING W REFLEX): HIV Screen 4th Generation wRfx: NONREACTIVE

## 2018-02-05 LAB — GC/CHLAMYDIA PROBE AMP (~~LOC~~) NOT AT ARMC
CHLAMYDIA, DNA PROBE: NEGATIVE
NEISSERIA GONORRHEA: POSITIVE — AB

## 2018-02-05 LAB — RPR: RPR Ser Ql: NONREACTIVE

## 2018-02-10 NOTE — ED Notes (Signed)
Called pt from lobby No response 1st attempt 

## 2018-02-11 ENCOUNTER — Emergency Department (HOSPITAL_COMMUNITY)
Admission: EM | Admit: 2018-02-11 | Discharge: 2018-02-11 | Discharge status: Against Medical Advice | Payer: Medicaid Other

## 2018-02-11 NOTE — ED Notes (Signed)
Pt called again from the lobby with no answer

## 2018-04-06 ENCOUNTER — Emergency Department (HOSPITAL_COMMUNITY)
Admission: EM | Admit: 2018-04-06 | Discharge: 2018-04-06 | Disposition: A | Discharge status: Home / Self Care | Payer: Medicaid Other | Attending: Emergency Medicine | Admitting: Emergency Medicine

## 2018-04-06 ENCOUNTER — Encounter (HOSPITAL_COMMUNITY): Payer: Self-pay | Admitting: Emergency Medicine

## 2018-04-06 DIAGNOSIS — F17210 Nicotine dependence, cigarettes, uncomplicated: Secondary | ICD-10-CM | POA: Insufficient documentation

## 2018-04-06 DIAGNOSIS — Z202 Contact with and (suspected) exposure to infections with a predominantly sexual mode of transmission: Secondary | ICD-10-CM | POA: Insufficient documentation

## 2018-04-06 MED ORDER — METRONIDAZOLE 500 MG PO TABS
2000.0000 mg | ORAL_TABLET | Freq: Once | ORAL | Status: AC
  Administered 2018-04-06: 2000 mg via ORAL
  Filled 2018-04-06: qty 4

## 2018-04-06 MED ORDER — AZITHROMYCIN 250 MG PO TABS
1000.0000 mg | ORAL_TABLET | Freq: Once | ORAL | Status: AC
  Administered 2018-04-06: 1000 mg via ORAL
  Filled 2018-04-06: qty 4

## 2018-04-06 MED ORDER — CEFTRIAXONE SODIUM 250 MG IJ SOLR
250.0000 mg | Freq: Once | INTRAMUSCULAR | Status: AC
  Administered 2018-04-06: 250 mg via INTRAMUSCULAR
  Filled 2018-04-06: qty 250

## 2018-04-06 MED ORDER — STERILE WATER FOR INJECTION IJ SOLN
INTRAMUSCULAR | Status: AC
  Administered 2018-04-06: 1.1 mL
  Filled 2018-04-06: qty 10

## 2018-04-06 NOTE — ED Triage Notes (Signed)
Patient here from home with complaints of std exposure. States that the girl he is with tested positive to trichomonas. Wants treatment "I want to be tested for everything".
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
## 2018-04-06 NOTE — ED Notes (Signed)
ED Provider at bedside. 

## 2018-04-06 NOTE — Discharge Instructions (Addendum)
Your evaluated in the emergency department for concerns that you may have had an STD.  You were tested for gonorrhea chlamydia trichomonas HIV and syphilis.  We are covering you with antibiotics and we will contact you if any results are positive for if you need any further testing.  We discussed using barrier protection

## 2018-04-07 LAB — GC/CHLAMYDIA PROBE AMP (~~LOC~~) NOT AT ARMC
Chlamydia: NEGATIVE
NEISSERIA GONORRHEA: NEGATIVE

## 2018-04-09 LAB — HIV ANTIBODY (ROUTINE TESTING W REFLEX): HIV SCREEN 4TH GENERATION: NONREACTIVE

## 2018-04-09 LAB — RPR: RPR: NONREACTIVE

## 2018-05-03 IMAGING — CT CT HEAD W/O CM
3 of 4 series · 14 of 47 positions shown, 16 images · non-contrast
Comparison: June 05, 2016

CLINICAL DATA: Intracranial hemorrhage of followup.

EXAM:
CT HEAD WITHOUT CONTRAST
TECHNIQUE: Contiguous axial images were obtained from the base of the skull
through the vertex without intravenous contrast.

[Series 2: head 5.0 h30s · axial · 0.44mm/px · z∈[-112,+23]mm · 8 of 33 slices shown, 10 images]
[im 3/33  brain]
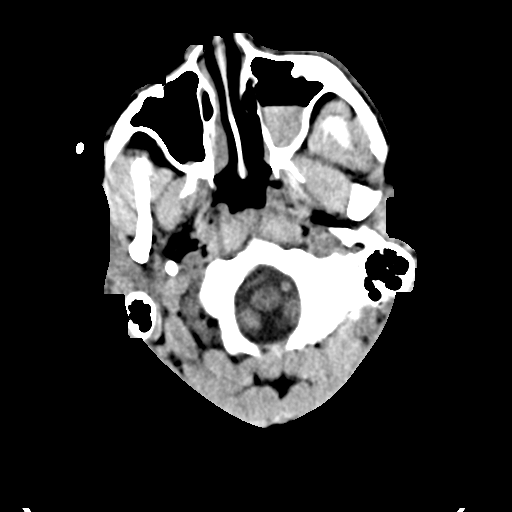
[im 3/33  bone]
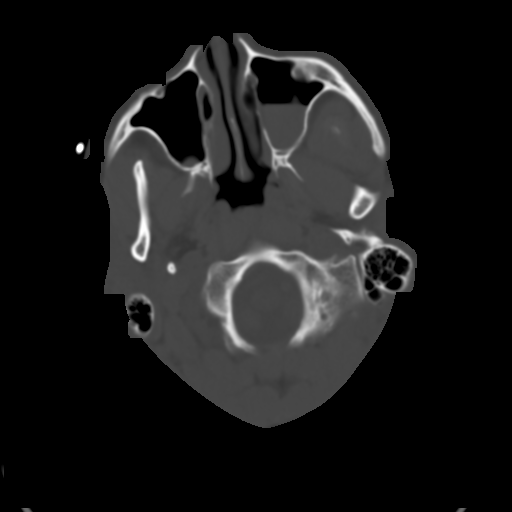
[im 7/33  brain]
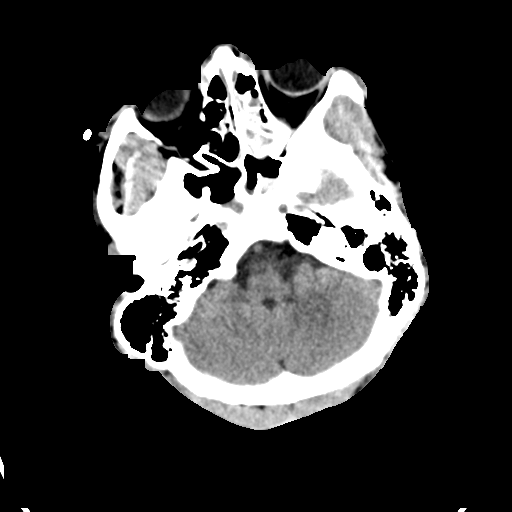
[im 12/33  brain]
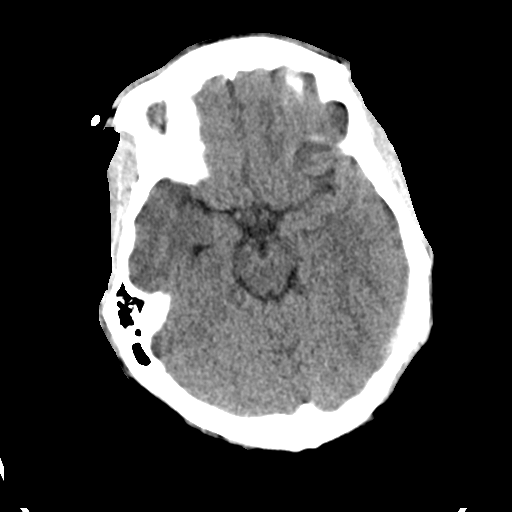
[im 14/33  brain]
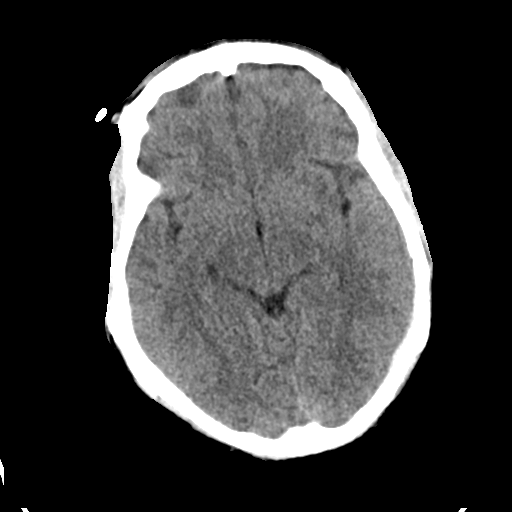
[im 19/33  brain]
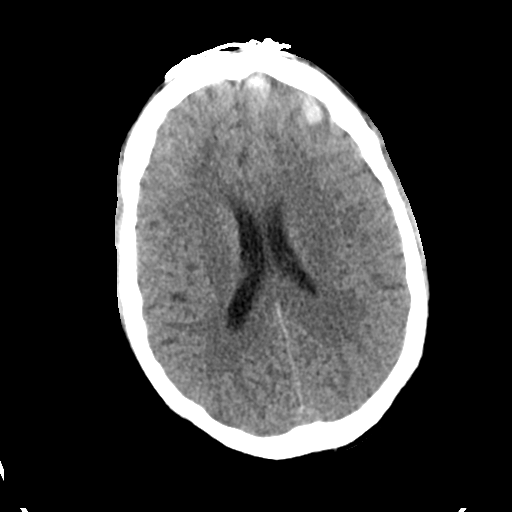
[im 19/33  bone]
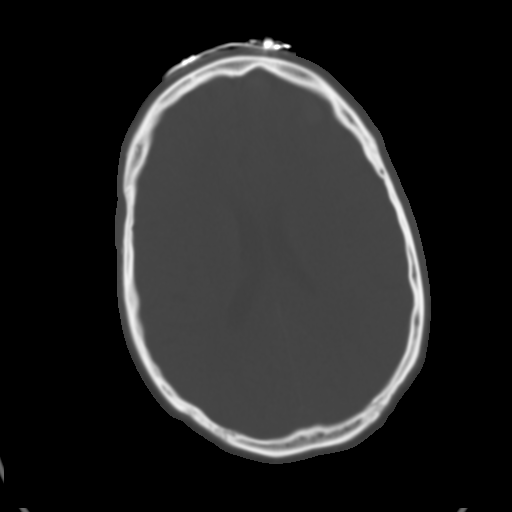
[im 21/33  brain]
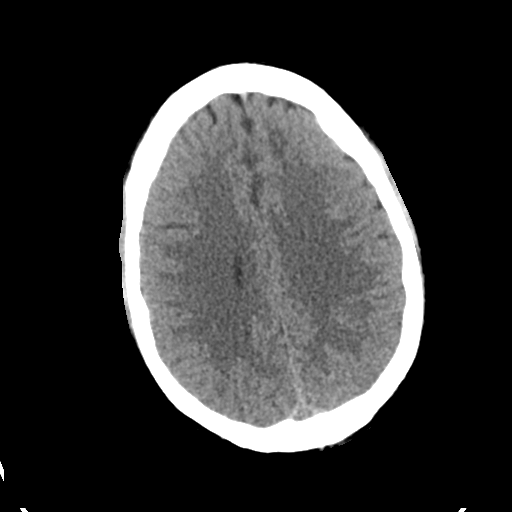
[im 26/33  brain]
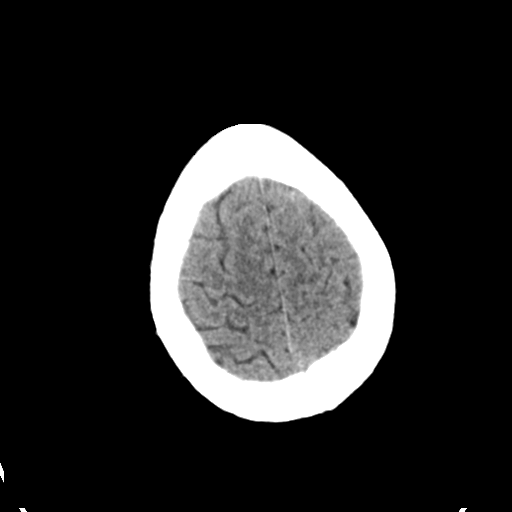
[im 30/33  brain]
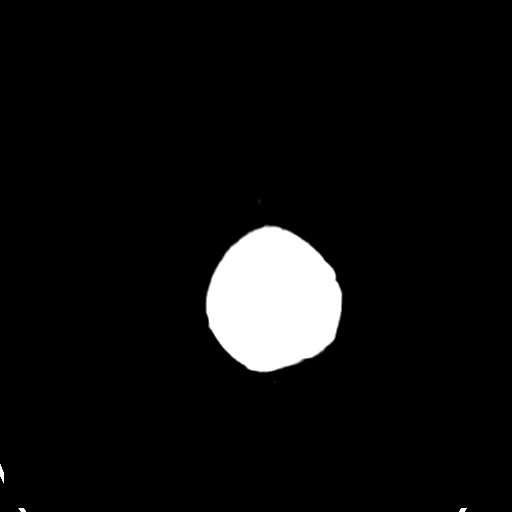

[Series 5: head 3.0 mpr · coronal · 0.30mm/px · 3 of 64 slices shown (1 of 2)]
[im 22/64  brain]
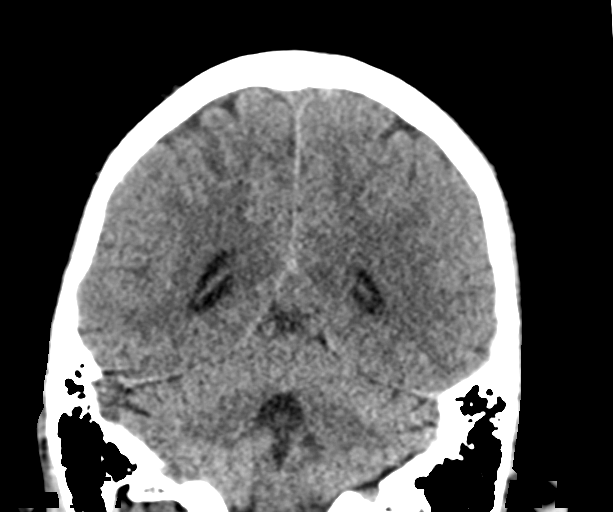
[im 29/64  brain]
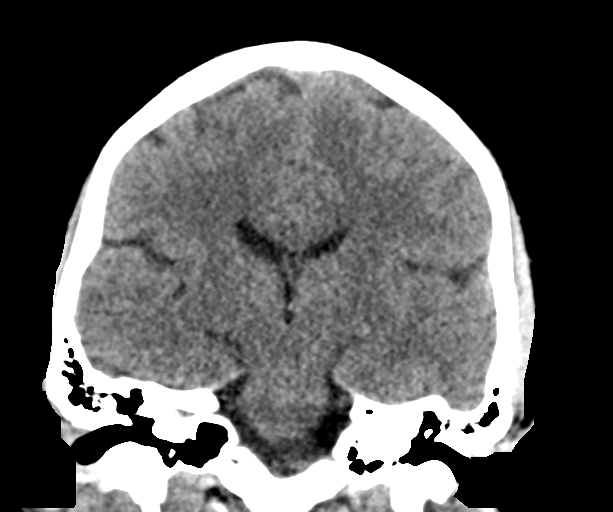
[im 36/64  brain]
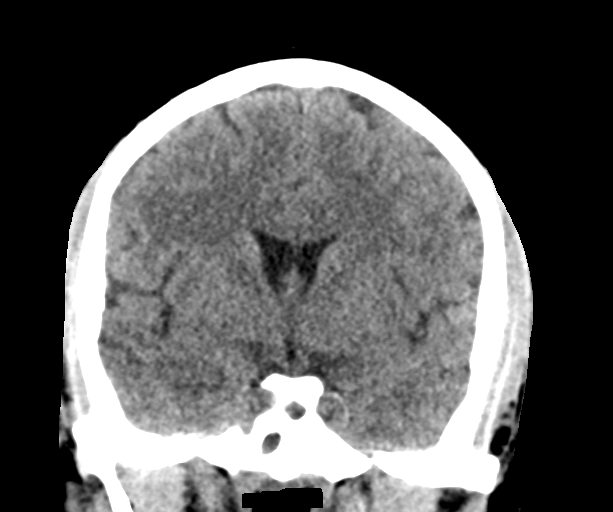

[Series 6: head 3.0 mpr · sagittal · 0.33mm/px · 3 of 54 slices shown (2 of 2)]
[im 18/54  brain]
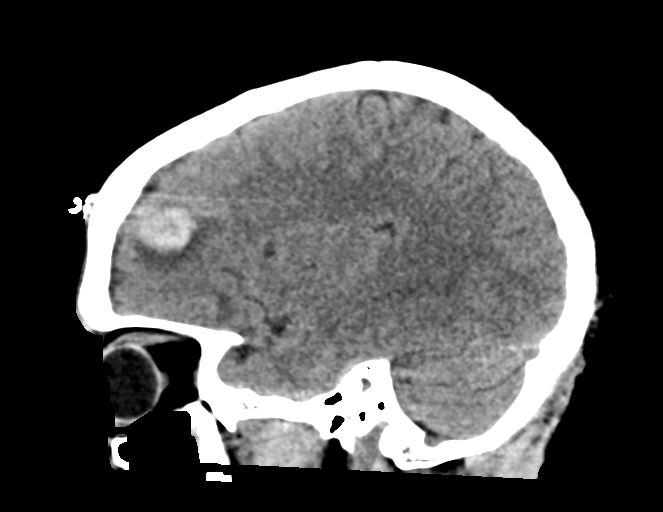
[im 27/54  brain]
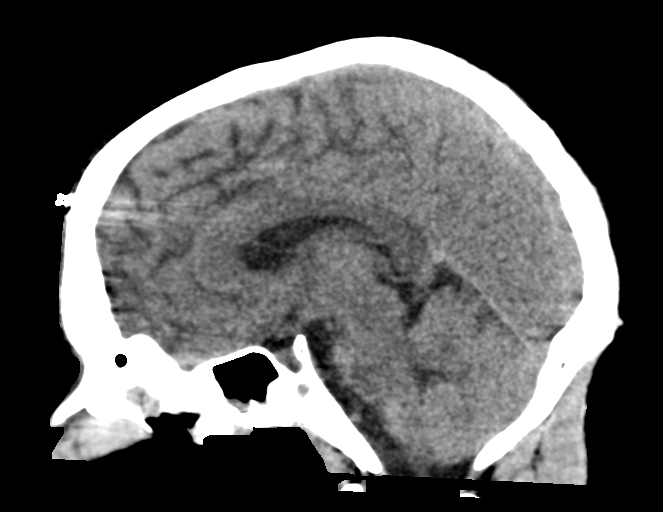
[im 36/54  brain]
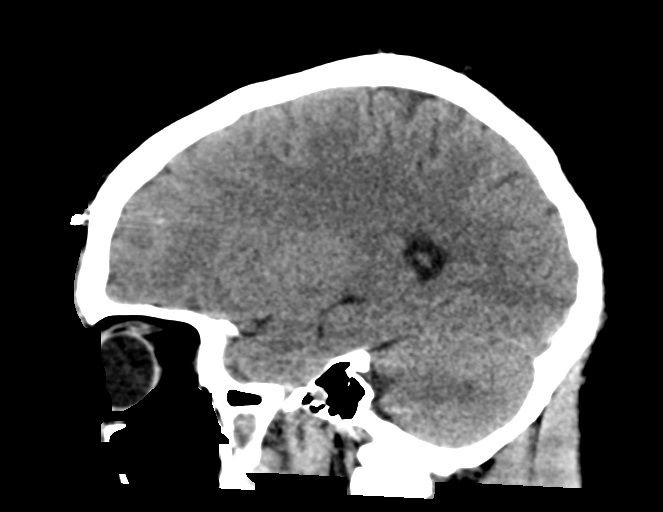

[14 of 47 positions shown; findings below may reference images not displayed]

FINDINGS: Brain: The previously noted left frontal intracranial hemorrhage
foci are stable in size compared prior exam. The largest focus
measures 1.2 x 1.6 cm. There is a focus hemorrhage just lateral to
the lateral aspect of the posterior horn lateral ventricle on image
18 unchanged. There is a new focus of intracranial hemorrhage in the
medial anterior left frontal lobe on image 19 measuring 1 x 0.5 cm.
There is a new 0.3 cm focus of hemorrhage in the right temporal lobe
on image 13. The previously noted small amount of left frontal
subdural blood is unchanged. The previously noted intraventricular
blood is unchanged. The mass-effect on the left lateral ventricle is
stable compared to prior study. There is no midline shift or
hydrocephalus.

Vascular: No hyperdense vessel is identified.

Skull: The previously noted left orbital, maxillary and sphenoid
fractures are unchanged.

Sinuses/Orbits: Layering blood in the left maxillary sinus is
slightly increased. Persistent mucoperiosteal thickening/ blood of
the bilateral ethmoid sinus are identified. There is increased blood
in the bilateral sphenoid sinuses.

Other: Left frontal parietal scalp hematoma is again identified.
IMPRESSION: The previously noted foci of left frontal intraparenchymal
hemorrhage, left subdural hematoma, intraventricular blood is
unchanged. There is a new focus of intraparenchymal hemorrhage of
the medial anterior left frontal lobe on image 19 measuring 1 x
cm. There is also a new focus of intraparenchymal hemorrhage
measuring 0.3 cm in the right temporal lobe on image 13.

Left orbital maxillary and sphenoid fractures with blood in the
sinuses as described.

## 2022-10-31 ENCOUNTER — Ambulatory Visit
Admission: EM | Admit: 2022-10-31 | Discharge: 2022-10-31 | Disposition: A | Discharge status: Home / Self Care | Payer: Medicare Other | Attending: Nurse Practitioner | Admitting: Nurse Practitioner

## 2022-10-31 DIAGNOSIS — H9201 Otalgia, right ear: Secondary | ICD-10-CM

## 2022-10-31 DIAGNOSIS — H6591 Unspecified nonsuppurative otitis media, right ear: Secondary | ICD-10-CM

## 2022-10-31 MED ORDER — NEOMYCIN-POLYMYXIN-DEXAMETH 3.5-10000-0.1 OP SUSP: OPHTHALMIC | 0 refills | Status: DC

## 2022-10-31 MED ORDER — AMOXICILLIN 500 MG PO TABS: 500.0000 mg | ORAL_TABLET | Freq: Three times a day (TID) | ORAL | 0 refills | Status: AC
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
## 2022-10-31 NOTE — Discharge Instructions (Addendum)
You have an ear infection which is called otitis media.   Take medications as prescribed.  Do not stop taking the medications even if you start to feel better. You may take tylenol and/or ibuprofen as needed for pain  Do not put anything in your ears besides the ear drops you were prescribed   Try not to get water in the ears   Go to the ED immediately if:  You have pain that is not helped with medicine. You have swelling, redness, or pain around your ear. You get a stiff neck. You cannot move part of your face (paralysis). You notice that the bone behind your ear hurts when you touch it. You get a very bad headache.

## 2022-10-31 NOTE — ED Triage Notes (Signed)
Pt presents with right ear pain X 1 week.

## 2023-01-03 ENCOUNTER — Ambulatory Visit
Admission: EM | Admit: 2023-01-03 | Discharge: 2023-01-03 | Disposition: A | Discharge status: Home / Self Care | Payer: Medicare Other | Attending: Physician Assistant | Admitting: Physician Assistant

## 2023-01-03 DIAGNOSIS — M25512 Pain in left shoulder: Secondary | ICD-10-CM | POA: Diagnosis not present

## 2023-01-03 DIAGNOSIS — M542 Cervicalgia: Secondary | ICD-10-CM

## 2023-01-03 DIAGNOSIS — M62838 Other muscle spasm: Secondary | ICD-10-CM

## 2023-01-03 MED ORDER — IBUPROFEN 600 MG PO TABS: 600.0000 mg | ORAL_TABLET | Freq: Four times a day (QID) | ORAL | 0 refills | Status: DC | PRN

## 2023-01-03 MED ORDER — TIZANIDINE HCL 4 MG PO TABS: 4.0000 mg | ORAL_TABLET | Freq: Four times a day (QID) | ORAL | 0 refills | Status: DC | PRN

## 2023-01-03 NOTE — Discharge Instructions (Signed)
Advised to use ice therapy, 10 minutes on 20 minutes off, 3-4 times throughout the evening to help reduce pain and swelling. Advised take ibuprofen 600 mg every 6 hours with food to help reduce pain and swelling. Advised take Zanaflex 4 mg every 6-8 hours as needed to prevent muscle spasm.  Advised to watch what types of activity you are doing as continual straining and stressing area will prolong healing time and cause the condition to worsen.  Advised follow-up PCP return to urgent care as needed.
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
## 2023-01-03 NOTE — ED Triage Notes (Signed)
Pt is here for neck and shoulder painx 3days. Pt has not tried any OTC meds for pain

## 2023-01-16 ENCOUNTER — Ambulatory Visit
Admission: EM | Admit: 2023-01-16 | Discharge: 2023-01-16 | Disposition: A | Discharge status: Home / Self Care | Payer: Medicare Other | Attending: Internal Medicine | Admitting: Internal Medicine

## 2023-01-16 ENCOUNTER — Ambulatory Visit (INDEPENDENT_AMBULATORY_CARE_PROVIDER_SITE_OTHER): Payer: Medicare Other

## 2023-01-16 DIAGNOSIS — R079 Chest pain, unspecified: Secondary | ICD-10-CM

## 2023-01-16 DIAGNOSIS — J438 Other emphysema: Secondary | ICD-10-CM | POA: Diagnosis not present

## 2023-01-16 NOTE — Discharge Instructions (Addendum)
Recommend you see a cardiologist and pulmonary so referral was made.  If they do not call within 48 hours, please call number at provided contact information to schedule appointment.  Please go to the ER if symptoms persist or worsen in the meantime.
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
## 2023-01-16 NOTE — ED Triage Notes (Signed)
Pt c/o chest tightness onset ~ "some months back" "last winter or last fall" denies CP in triage.

## 2023-02-01 ENCOUNTER — Ambulatory Visit (INDEPENDENT_AMBULATORY_CARE_PROVIDER_SITE_OTHER): Payer: Medicare Other | Admitting: Pulmonary Disease

## 2023-02-01 ENCOUNTER — Encounter: Payer: Self-pay | Admitting: Pulmonary Disease

## 2023-02-01 VITALS — BP 120/78 | HR 67 | Ht 69.0 in | Wt 136.0 lb

## 2023-02-01 DIAGNOSIS — J431 Panlobular emphysema: Secondary | ICD-10-CM | POA: Diagnosis not present
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
## 2023-02-01 NOTE — Patient Instructions (Signed)
Schedule for CT scan of the chest without contrast for bullous emphysema  Schedule for pulmonary function test  Follow-up in 4 to 6 weeks  Call us with significant concerns

## 2023-02-15 ENCOUNTER — Ambulatory Visit (INDEPENDENT_AMBULATORY_CARE_PROVIDER_SITE_OTHER): Payer: Medicare Other | Admitting: Pulmonary Disease

## 2023-02-15 ENCOUNTER — Ambulatory Visit (HOSPITAL_BASED_OUTPATIENT_CLINIC_OR_DEPARTMENT_OTHER): Payer: Medicare Other | Attending: Pulmonary Disease

## 2023-02-15 DIAGNOSIS — J431 Panlobular emphysema: Secondary | ICD-10-CM | POA: Diagnosis not present

## 2023-02-15 LAB — PULMONARY FUNCTION TEST
DL/VA % pred: 88 %
DL/VA: 4.41 ml/min/mmHg/L
DLCO cor % pred: 75 %
DLCO cor: 24.72 ml/min/mmHg
DLCO unc % pred: 75 %
DLCO unc: 24.72 ml/min/mmHg
FEF 25-75 Post: 3.47 L/sec
FEF 25-75 Pre: 3.21 L/sec
FEF2575-%Change-Post: 8 %
FEF2575-%Pred-Post: 75 %
FEF2575-%Pred-Pre: 70 %
FEV1-%Change-Post: 1 %
FEV1-%Pred-Post: 82 %
FEV1-%Pred-Pre: 80 %
FEV1-Post: 3.7 L
FEV1-Pre: 3.63 L
FEV1FVC-%Change-Post: 3 %
FEV1FVC-%Pred-Pre: 93 %
FEV6-%Change-Post: -1 %
FEV6-%Pred-Post: 85 %
FEV6-%Pred-Pre: 86 %
FEV6-Post: 4.64 L
FEV6-Pre: 4.71 L
FEV6FVC-%Change-Post: 0 %
FEV6FVC-%Pred-Post: 101 %
FEV6FVC-%Pred-Pre: 100 %
FVC-%Change-Post: -1 %
FVC-%Pred-Post: 84 %
FVC-%Pred-Pre: 85 %
FVC-Post: 4.64 L
FVC-Pre: 4.71 L
Post FEV1/FVC ratio: 80 %
Post FEV6/FVC ratio: 100 %
Pre FEV1/FVC ratio: 77 %
Pre FEV6/FVC Ratio: 100 %
RV % pred: 172 %
RV: 2.73 L
TLC % pred: 98 %
TLC: 6.79 L

## 2023-02-15 NOTE — Patient Instructions (Signed)
Full PFT Performed Today  

## 2023-02-15 NOTE — Progress Notes (Signed)
Full PFT Performed Today  

## 2023-02-21 ENCOUNTER — Encounter: Payer: Self-pay | Admitting: Pulmonary Disease

## 2023-02-21 ENCOUNTER — Ambulatory Visit (INDEPENDENT_AMBULATORY_CARE_PROVIDER_SITE_OTHER): Payer: Medicare Other | Admitting: Pulmonary Disease

## 2023-02-21 VITALS — BP 110/78 | HR 65 | Ht 70.0 in | Wt 139.2 lb

## 2023-02-21 DIAGNOSIS — J431 Panlobular emphysema: Secondary | ICD-10-CM

## 2023-02-21 DIAGNOSIS — Z716 Tobacco abuse counseling: Secondary | ICD-10-CM | POA: Diagnosis not present

## 2023-02-21 NOTE — Patient Instructions (Signed)
Make sure you follow-up with your CT scan that scheduled  We will get blood work for alpha-1 antitrypsin levels  Continue to work on quitting smoking  I will see you back in about 3 months  Call us with significant concerns
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
## 2023-03-07 ENCOUNTER — Ambulatory Visit (HOSPITAL_BASED_OUTPATIENT_CLINIC_OR_DEPARTMENT_OTHER): Payer: Medicare Other | Admitting: Cardiology

## 2023-03-07 LAB — ALPHA-1 ANTITRYPSIN PHENOTYPE: A-1 Antitrypsin, Ser: 98 mg/dL (ref 83–199)

## 2023-03-22 ENCOUNTER — Other Ambulatory Visit: Payer: Medicare Other

## 2023-03-27 ENCOUNTER — Other Ambulatory Visit: Payer: Medicare Other

## 2023-05-20 ENCOUNTER — Ambulatory Visit (HOSPITAL_BASED_OUTPATIENT_CLINIC_OR_DEPARTMENT_OTHER): Payer: Medicare Other | Admitting: Cardiology

## 2023-05-28 ENCOUNTER — Telehealth: Payer: Self-pay | Admitting: Pulmonary Disease

## 2023-05-28 NOTE — Telephone Encounter (Signed)
Alpha-1 antitrypsin level of 98, phenotype EZ

## 2023-06-25 ENCOUNTER — Ambulatory Visit: Payer: Medicare Other | Admitting: Pulmonary Disease

## 2023-06-28 ENCOUNTER — Encounter: Payer: Self-pay | Admitting: Pulmonary Disease

## 2023-10-14 ENCOUNTER — Ambulatory Visit (HOSPITAL_BASED_OUTPATIENT_CLINIC_OR_DEPARTMENT_OTHER): Payer: Medicare Other | Admitting: Cardiology

## 2024-03-19 ENCOUNTER — Ambulatory Visit: Admission: EM | Admit: 2024-03-19 | Discharge: 2024-03-19 | Disposition: A | Discharge status: Home / Self Care

## 2024-03-19 ENCOUNTER — Encounter: Payer: Self-pay | Admitting: Emergency Medicine

## 2024-03-19 DIAGNOSIS — S39012A Strain of muscle, fascia and tendon of lower back, initial encounter: Secondary | ICD-10-CM | POA: Diagnosis not present

## 2024-03-19 MED ORDER — DICLOFENAC SODIUM 50 MG PO TBEC: 50.0000 mg | DELAYED_RELEASE_TABLET | Freq: Two times a day (BID) | ORAL | 1 refills | Status: DC

## 2024-03-19 MED ORDER — BACLOFEN 10 MG PO TABS: 10.0000 mg | ORAL_TABLET | Freq: Three times a day (TID) | ORAL | 0 refills | Status: DC

## 2024-03-19 NOTE — Discharge Instructions (Signed)
  1. Lumbar strain, initial encounter (Primary) - diclofenac (VOLTAREN) 50 MG EC tablet; Take 1 tablet (50 mg total) by mouth 2 (two) times daily.  Dispense: 30 tablet; Refill: 1 - baclofen (LIORESAL) 10 MG tablet; Take 1 tablet (10 mg total) by mouth 3 (three) times daily.  Dispense: 30 each; Refill: 0 -Do not take baclofen when you have to work or drive as it may cause drowsiness and impairment. -No heavy lifting or strenuous physical activity for the next 2 to 3 days until symptoms improve. -Continue to monitor symptoms for any change in severity if there is any escalation of current symptoms or development of new symptoms follow-up in ER for further evaluation and management.
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
## 2024-03-19 NOTE — ED Triage Notes (Signed)
 Pt reports low back pain x2 weeks. Notes it has gradually gotten worse over this period and believes it is due to overuse injury from work (repeatedly moving boxes). Pt notes dull pain that is aggravated by bending over or twisting side to side. Has not used any medications for symptoms.

## 2024-07-29 ENCOUNTER — Encounter: Payer: Self-pay | Admitting: Emergency Medicine

## 2024-07-29 ENCOUNTER — Ambulatory Visit
Admission: EM | Admit: 2024-07-29 | Discharge: 2024-07-29 | Disposition: A | Discharge status: Home / Self Care | Attending: Family Medicine | Admitting: Family Medicine

## 2024-07-29 DIAGNOSIS — M5442 Lumbago with sciatica, left side: Secondary | ICD-10-CM | POA: Diagnosis not present

## 2024-07-29 DIAGNOSIS — S39012A Strain of muscle, fascia and tendon of lower back, initial encounter: Secondary | ICD-10-CM

## 2024-07-29 MED ORDER — IBUPROFEN 800 MG PO TABS: 800.0000 mg | ORAL_TABLET | Freq: Three times a day (TID) | ORAL | 0 refills | Status: AC | PRN

## 2024-07-29 MED ORDER — BACLOFEN 10 MG PO TABS: 10.0000 mg | ORAL_TABLET | Freq: Three times a day (TID) | ORAL | 0 refills | Status: AC | PRN
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
## 2024-07-29 NOTE — Discharge Instructions (Signed)
 Take ibuprofen  800 mg--1 tab every 8 hours as needed for pain.  Take baclofen  10 mg--1 tablet every 8 hours as needed for muscle spasm or muscle pain.  This medication could cause drowsiness or dizziness  You can use the QR code/website at the back of the summary paperwork to schedule yourself a new patient appointment with primary care
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
## 2024-08-20 ENCOUNTER — Ambulatory Visit (HOSPITAL_COMMUNITY)
Admission: EM | Admit: 2024-08-20 | Discharge: 2024-08-20 | Disposition: A | Discharge status: Home / Self Care | Attending: Psychiatry | Admitting: Psychiatry

## 2024-08-20 DIAGNOSIS — G4700 Insomnia, unspecified: Secondary | ICD-10-CM | POA: Insufficient documentation

## 2024-08-20 DIAGNOSIS — F331 Major depressive disorder, recurrent, moderate: Secondary | ICD-10-CM

## 2024-08-20 DIAGNOSIS — F4323 Adjustment disorder with mixed anxiety and depressed mood: Secondary | ICD-10-CM | POA: Diagnosis present

## 2024-08-20 NOTE — Discharge Instructions (Addendum)
Discharge recommendations:   Medications: Patient is to take medications as prescribed. The patient or patient's guardian is to contact a medical professional and/or outpatient provider to address any new side effects that develop. The patient or the patient's guardian should update outpatient providers of any new medications and/or medication changes.    Outpatient Follow up: Please review list of outpatient resources for psychiatry and counseling. Please follow up with your primary care provider for all medical related needs.    Therapy: We recommend that patient participate in individual therapy to address mental health concerns.   Atypical antipsychotics: If you are prescribed an atypical antipsychotic, it is recommended that your height, weight, BMI, blood pressure, fasting lipid panel, and fasting blood sugar be monitored by your outpatient providers.  Safety:   The following safety precautions should be taken:   No sharp objects. This includes scissors, razors, scrapers, and putty knives.   Chemicals should be removed and locked up.   Medications should be removed and locked up.   Weapons should be removed and locked up. This includes firearms, knives and instruments that can be used to cause injury.   The patient should abstain from use of illicit substances/drugs and abuse of any medications.  If symptoms worsen or do not continue to improve or if the patient becomes actively suicidal or homicidal then it is recommended that the patient return to the closest hospital emergency department, the [facility], or call 911 for further evaluation and treatment. National Suicide Prevention Lifeline 1-800-SUICIDE or 872-884-3776.  About 988 988 offers 24/7 access to trained crisis counselors who can help people experiencing mental health-related distress. People can call or text 988 or chat 988lifeline.org for themselves or if they are worried about a  loved one who may need crisis support.    Discharge recommendations:   Medications: Patient is to take medications as prescribed. The patient or patient's guardian is to contact a medical professional and/or outpatient provider to address any new side effects that develop. The patient or the patient's guardian should update outpatient providers of any new medications and/or medication changes.    Outpatient Follow up: Please review list of outpatient resources for psychiatry and counseling. Please follow up with your primary care provider for all medical related needs.    Therapy: We recommend that patient participate in individual therapy to address mental health concerns.   Atypical antipsychotics: If you are prescribed an atypical antipsychotic, it is recommended that your height, weight, BMI, blood pressure, fasting lipid panel, and fasting blood sugar be monitored by your outpatient providers.  Safety:   The following safety precautions should be taken:   No sharp objects. This includes scissors, razors, scrapers, and putty knives.   Chemicals should be removed and locked up.   Medications should be removed and locked up.   Weapons should be removed and locked up. This includes firearms, knives and instruments that can be used to cause injury.   The patient should abstain from use of illicit substances/drugs and abuse of any medications.  If symptoms worsen or do not continue to improve or if the patient becomes actively suicidal or homicidal then it is recommended that the patient return to the closest hospital emergency department, the [facility], or call 911 for further evaluation and treatment. National Suicide Prevention Lifeline 1-800-SUICIDE or 919-290-2387.  About 988 988 offers 24/7 access to trained crisis counselors who can help people experiencing mental health-related distress. People can call  or text 988 or chat 988lifeline.org for  themselves or if they are worried about a loved one who may need crisis support.

## 2024-08-20 NOTE — Progress Notes (Signed)
   08/20/24 0815  [facility] Triage Screening (Walk-ins at [facility] only)  What Is the Reason for Your Visit/Call Today? Travis Mcguire 27y male arrived to [facility] via [facility]RT. Per BHRT, pt went into his job today and shared he had SI, needed someone to talk to; BHRT was called. BHRT also stated that the pt shared that he had a hx of two suicide attempts - 2015-2016 (traffic) and an overdose (pills) attempt (more recently, unsure of time). PT admitted to endorsing SI but stated that he did not plan on doing it; it was more so of wanting to be with his deceased aunt. PT shared that he has nieces and nephews that he cannot leave. PT mentioned he didn't want to be held here. PT explains that he is grieving the passing of his aunt within the past week (who was like a mother figure); he just wants to isolate and drink. PT denied HI, AVH and substance use.  How Long Has This Been Causing You Problems? <Week  Have You Recently Had Any Thoughts About Hurting Yourself? Yes  How long ago did you have thoughts about hurting yourself? Today  Are You Planning to Commit Suicide/Harm Yourself At This time? No  Have you Recently Had Thoughts About Hurting Someone Sherral? No  Are You Planning To Harm Someone At This Time? No  Physical Abuse Denies  Verbal Abuse Denies  Sexual Abuse Denies  Exploitation of patient/patient's resources Denies  Self-Neglect Denies  Are you currently experiencing any auditory, visual or other hallucinations? No  Have You Used Any Alcohol or Drugs in the Past 24 Hours? Yes  What Did You Use and How Much? 1/2 fifth of liquor  Do you have any current medical co-morbidities that require immediate attention? No  Clinician description of patient physical appearance/behavior: smell of alcohol, cooperative, calm  What Do You Feel Would Help You the Most Today? Treatment for Depression or other mood problem;Stress Management;Social Support  Determination of Need Urgent (48 hours)  Options For Referral  Outpatient Therapy;Medication Management;BH Urgent Care;Intensive Outpatient Therapy  Determination of Need filed? Yes
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
